# Patient Record
Sex: Male | Born: 1950 | Race: Black or African American | Hispanic: No | Marital: Married | Smoking: Never smoker
Health system: Southern US, Community
[De-identification: ages and names within clinical notes are randomized; demographics above are authoritative.]

## PROBLEM LIST (undated history)

## (undated) DIAGNOSIS — I10 Essential (primary) hypertension: Secondary | ICD-10-CM

## (undated) HISTORY — DX: Essential (primary) hypertension: I10

---

## 2001-07-23 ENCOUNTER — Emergency Department (HOSPITAL_COMMUNITY): Admission: EM | Admit: 2001-07-23 | Discharge: 2001-07-23 | Payer: Self-pay

## 2019-11-22 ENCOUNTER — Encounter: Payer: Self-pay | Admitting: Internal Medicine

## 2019-12-06 ENCOUNTER — Ambulatory Visit: Payer: Medicare Other | Admitting: Internal Medicine

## 2019-12-06 ENCOUNTER — Other Ambulatory Visit: Payer: Self-pay

## 2019-12-06 ENCOUNTER — Encounter: Payer: Self-pay | Admitting: Internal Medicine

## 2019-12-06 VITALS — BP 172/86 | HR 105 | Ht 71.0 in | Wt 233.8 lb

## 2019-12-06 DIAGNOSIS — I10 Essential (primary) hypertension: Secondary | ICD-10-CM | POA: Diagnosis not present

## 2019-12-06 DIAGNOSIS — R42 Dizziness and giddiness: Secondary | ICD-10-CM

## 2019-12-06 DIAGNOSIS — I493 Ventricular premature depolarization: Secondary | ICD-10-CM

## 2019-12-06 NOTE — Patient Instructions (Signed)
Medication Instructions:  No changes *If you need a refill on your cardiac medications before your next appointment, please call your pharmacy*  Lab Work: None ordered  Testing/Procedures: Your physician has requested that you have an echocardiogram. Echocardiography is a painless test that uses sound waves to create images of your heart. It provides your doctor with information about the size and shape of your heart and how well your heart's chambers and valves are working. This procedure takes approximately one hour. There are no restrictions for this procedure. [place] has requested that you have a lexiscan myoview. For further information please visit [email or web address]. Please follow instruction sheet, as given. [place]    Follow-Up: At [facility], you and your health needs are our priority.  As part of our continuing mission to provide you with exceptional heart care, we have created designated Provider Care Teams.  These Care Teams include your primary Cardiologist (physician) and Advanced Practice Providers (APPs -  Physician Assistants and Nurse Practitioners) who all work together to provide you with the care you need, when you need it.  Your next appointment:   1 month(s)  The format for your next appointment:   In Person  Provider:   K. Mali Hilty, MD  Other Instructions Purchase an Omron Arm Blood Pressure Cuff to monitor your BP daily

## 2019-12-07 ENCOUNTER — Encounter: Payer: Self-pay | Admitting: Internal Medicine

## 2019-12-07 NOTE — Progress Notes (Signed)
OFFICE CONSULT NOTE  Chief Complaint:  Palpitations, lightheadedness  Primary Care Physician: London Pepper, MD  HPI:  Samuel George is a 69 y.o. male who is being seen today for the evaluation of lightheadedness at the request of London Pepper, MD.  This is a pleasant 69 year old male with a longstanding history of hypertension however he said it was borderline and had not been treated until recently.  He had complained of some positional lightheadedness and mild shortness of breath with exertion however he chalked that up to not exercising much.  Recently saw his PCP and an EKG was performed which showed frequent PVCs.  Today he is noted to be in sinus rhythm with PVCs suggestive of a bigeminal rhythm.  He is not currently describing any palpitations.  Blood pressure was elevated significantly today 172/86.  He was recently started on amlodipine 5 mg daily and is on low-dose aspirin.  He reports no significant history of heart disease in the family.  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  He says he generally does not get chest pain or shortness of breath with exertion.  Recent labs were performed and appeared to be essentially normal.  Creatinine is normal.  Potassium was 4.2.  TSH was normal.  PMHx:  Past Medical History:  Diagnosis Date  . Hypertension     History reviewed. No pertinent surgical history.  FAMHx:  History reviewed. No pertinent family history.  No pertinent cardiac history.  SOCHx:   reports that he has never smoked. He has never used smokeless tobacco. No history on file for alcohol and drug.  ALLERGIES:  No Known Allergies  ROS: Pertinent items noted in HPI and remainder of comprehensive ROS otherwise negative.  HOME MEDS: Current Outpatient Medications on File Prior to Visit  Medication Sig Dispense Refill  . amLODipine (NORVASC) 5 MG tablet Take 5 mg by mouth daily.    [name] aspirin EC 81  MG tablet Take 81 mg by mouth daily.     No current facility-administered medications on file prior to visit.    LABS/IMAGING: No results found for this or any previous visit (from the past 48 hour(s)). No results found.  LIPID PANEL: No results found for: CHOL, TRIG, HDL, CHOLHDL, VLDL, LDLCALC, LDLDIRECT  WEIGHTS: Wt Readings from Last 3 Encounters:  12/06/19 233 lb 12.8 oz (106.1 kg)    VITALS: BP (!) 172/86   Pulse (!) 105   Ht 5\' 11"  (1.803 m)   Wt 233 lb 12.8 oz (106.1 kg)   SpO2 99%   BMI 32.61 kg/m   EXAM: General appearance: alert and no distress Neck: no carotid bruit, no JVD and thyroid not enlarged, symmetric, no tenderness/mass/nodules Lungs: clear to auscultation bilaterally Heart: regular rate and rhythm, S1, S2 normal, no murmur, click, rub or gallop Abdomen: soft, non-tender; bowel sounds normal; no masses,  no organomegaly Extremities: extremities normal, atraumatic, no cyanosis or edema Pulses: 2+ and symmetric Skin: Skin color, texture, turgor normal. No rashes or lesions Neurologic: Grossly normal Psych: Pleasant  EKG: Sinus rhythm with frequent PVCs and bigeminy, moderate voltage for LVH, nonspecific ST and T wave changes at 105- personally reviewed  ASSESSMENT: 1. 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 Lightheadedness/palpitations 3. Uncontrolled hypertension  PLAN: 1.   Samuel George is found to have an abnormal EKG with frequent PVCs and bigeminy.  This may be the cause of his symptoms however the etiology  of this is unclear.  He likely has had longstanding uncontrolled hypertension with evidence of LVH on his EKG.  He says it was never "high enough" to be treated until recently.  I would like to get an echo to see if there is any structural changes to his heart as well as a Lexiscan Myoview to rule out any ischemia.  Finally will place a 2-week monitor to see if we can pick up in his palpitations and determine his burden of PVCs.  He will  likely need beta-blocker therapy to see if we can suppress his PVCs.  Thanks again for the referral.  Follow-up with me afterwards.  Pixie Casino, MD, [facility], [place] Director of the Advanced Lipid Disorders &  Cardiovascular Risk Reduction Clinic Diplomate of the American Board of Clinical Lipidology Attending Cardiologist  Direct Dial: [phone]  Fax: [phone]  Website:  [email or web address] 12/07/2019, 10:41 AM

## 2019-12-11 ENCOUNTER — Telehealth: Payer: Self-pay | Admitting: *Deleted

## 2019-12-11 NOTE — Telephone Encounter (Signed)
14 day ZIO XT to be mailed to the patients home.  Patient to apply the monitor after his Echocardiogram which is scheduled on 12/20/2019.   The ZIO patch monitor cannot be removed and re-applied to accommodate other testing.  Instructions reviewed briefly with patient as they are included in the monitor kit.

## 2019-12-11 NOTE — Addendum Note (Signed)
Addended by: Fidel Levy on: 12/11/2019 09:36 AM   Modules accepted: Orders

## 2019-12-14 ENCOUNTER — Telehealth (HOSPITAL_COMMUNITY): Payer: Self-pay | Admitting: *Deleted

## 2019-12-14 NOTE — Telephone Encounter (Signed)
Patient given detailed instructions per Myocardial Perfusion Study Information Sheet for the test on 12/20/19. Patient notified to arrive 15 minutes early and that it is imperative to arrive on time for appointment to keep from having the test rescheduled.  If you need to cancel or reschedule your appointment, please call the office within 24 hours of your appointment. . Patient verbalized understanding. Kirstie Peri

## 2019-12-20 ENCOUNTER — Ambulatory Visit (HOSPITAL_BASED_OUTPATIENT_CLINIC_OR_DEPARTMENT_OTHER): Payer: Medicare Other

## 2019-12-20 ENCOUNTER — Ambulatory Visit (HOSPITAL_COMMUNITY): Payer: Medicare Other | Attending: Cardiovascular Disease

## 2019-12-20 ENCOUNTER — Other Ambulatory Visit: Payer: Self-pay

## 2019-12-20 ENCOUNTER — Other Ambulatory Visit (HOSPITAL_COMMUNITY): Payer: Medicare Other

## 2019-12-20 VITALS — Ht 71.0 in | Wt 233.0 lb

## 2019-12-20 DIAGNOSIS — I493 Ventricular premature depolarization: Secondary | ICD-10-CM | POA: Insufficient documentation

## 2019-12-20 DIAGNOSIS — I517 Cardiomegaly: Secondary | ICD-10-CM | POA: Diagnosis not present

## 2019-12-20 DIAGNOSIS — R42 Dizziness and giddiness: Secondary | ICD-10-CM | POA: Diagnosis present

## 2019-12-20 DIAGNOSIS — I10 Essential (primary) hypertension: Secondary | ICD-10-CM | POA: Insufficient documentation

## 2019-12-20 LAB — MYOCARDIAL PERFUSION IMAGING
LV dias vol: 144 mL (ref 62–150)
LV sys vol: 95 mL
Peak HR: 87 {beats}/min
Rest HR: 67 {beats}/min
SDS: 0
SRS: 0
SSS: 0
TID: 1.15

## 2019-12-20 LAB — ECHOCARDIOGRAM COMPLETE
Height: 71 in
Weight: 3728 oz

## 2019-12-20 MED ORDER — REGADENOSON 0.4 MG/5ML IV SOLN
0.4000 mg | Freq: Once | INTRAVENOUS | Status: AC
  Administered 2019-12-20: 0.4 mg via INTRAVENOUS

## 2019-12-20 MED ORDER — TECHNETIUM TC 99M TETROFOSMIN IV KIT
10.1000 | PACK | Freq: Once | INTRAVENOUS | Status: AC | PRN
  Administered 2019-12-20: 10.1 via INTRAVENOUS
  Filled 2019-12-20: qty 11

## 2019-12-20 MED ORDER — TECHNETIUM TC 99M TETROFOSMIN IV KIT
32.7000 | PACK | Freq: Once | INTRAVENOUS | Status: AC | PRN
  Administered 2019-12-20: 32.7 via INTRAVENOUS
  Filled 2019-12-20: qty 33

## 2019-12-21 ENCOUNTER — Telehealth: Payer: Self-pay | Admitting: Internal Medicine

## 2019-12-21 ENCOUNTER — Ambulatory Visit (INDEPENDENT_AMBULATORY_CARE_PROVIDER_SITE_OTHER): Payer: Medicare Other

## 2019-12-21 DIAGNOSIS — I493 Ventricular premature depolarization: Secondary | ICD-10-CM | POA: Diagnosis not present

## 2019-12-21 NOTE — Telephone Encounter (Signed)
LM for patient concerning 01/02/2020 visit with MD. He is supposed to be f/u on testing but his 14 day monitor was not applied until 12/20/2019. The results of this will not be available for his f/u visit. Asked that patient call back to r/s his f/u visit for the next week or even later, so all results will be able to be reviewed at visit with MD

## 2019-12-25 NOTE — Telephone Encounter (Signed)
Spoke with patient about r/s appt for Feb 2 since monitor results will not be back. He is OK with this. Moved appt to Feb 10 @ 2:30pm

## 2020-01-02 ENCOUNTER — Ambulatory Visit: Payer: Medicare Other | Admitting: Internal Medicine

## 2020-01-10 ENCOUNTER — Other Ambulatory Visit: Payer: Self-pay

## 2020-01-10 ENCOUNTER — Encounter: Payer: Self-pay | Admitting: Internal Medicine

## 2020-01-10 ENCOUNTER — Ambulatory Visit: Payer: Medicare Other | Admitting: Internal Medicine

## 2020-01-10 VITALS — BP 186/91 | HR 70 | Temp 97.3°F | Ht 72.0 in | Wt 227.8 lb

## 2020-01-10 DIAGNOSIS — I493 Ventricular premature depolarization: Secondary | ICD-10-CM

## 2020-01-10 DIAGNOSIS — I10 Essential (primary) hypertension: Secondary | ICD-10-CM

## 2020-01-10 MED ORDER — AMLODIPINE BESYLATE 10 MG PO TABS: 10.0000 mg | ORAL_TABLET | Freq: Every day | ORAL | 3 refills | Status: DC

## 2020-01-10 MED ORDER — CARVEDILOL 6.25 MG PO TABS: 6.2500 mg | ORAL_TABLET | Freq: Two times a day (BID) | ORAL | 3 refills | Status: DC

## 2020-01-10 NOTE — Patient Instructions (Signed)
Medication Instructions:  INCREASE amlodipine to 10mg  daily START carvedilol 6.25mg  twice daily *If you need a refill on your cardiac medications before your next appointment, please call your pharmacy*  Follow-Up: At [facility], you and your health needs are our priority.  As part of our continuing mission to provide you with exceptional heart care, we have created designated Provider Care Teams.  These Care Teams include your primary Cardiologist (physician) and Advanced Practice Providers (APPs -  Physician Assistants and Nurse Practitioners) who all work together to provide you with the care you need, when you need it.  Your next appointment:   1 month(s)  The format for your next appointment:   Virtual Visit   Provider:   K. Mali Hilty, MD  Other Instructions

## 2020-01-10 NOTE — Progress Notes (Signed)
OFFICE CONSULT NOTE  Chief Complaint:  Palpitations, lightheadedness  Primary Care Physician: London Pepper, MD  HPI:  Samuel George is a 69 y.o. male who is being seen today for the evaluation of lightheadedness at the request of London Pepper, MD.  This is a pleasant 69 year old male with a longstanding history of hypertension however he said it was borderline and had not been treated until recently.  He had complained of some positional lightheadedness and mild shortness of breath with exertion however he chalked that up to not exercising much.  Recently saw his PCP and an EKG was performed which showed frequent PVCs.  Today he is noted to be in sinus rhythm with PVCs suggestive of a bigeminal rhythm.  He is not currently describing any palpitations.  Blood pressure was elevated significantly today 172/86.  He was recently started on amlodipine 5 mg daily and is on low-dose aspirin.  He reports no significant history of heart disease in the family.  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  He says he generally does not get chest pain or shortness of breath with exertion.  Recent labs were performed and appeared to be essentially normal.  Creatinine is normal.  Potassium was 4.2.  TSH was normal.  01/10/2020  Mr. Kidney returns today for follow-up.  He reported getting a blood pressure cuff but cannot tell me what his blood pressure readings were.  He says he was started on another blood pressure medicine but was not clear what that was.  He then told me that he ran out of his medications and had no refills.  That being said he was scheduled for follow-up with Dr. Orland Mustard in May.  He did undergo an echocardiogram and nuclear stress test.  The echo showed normal systolic function, no LVH and no valvular abnormalities.  The Myoview stress test showed normal perfusion with frequent PVCs and LVEF of 34% however this was felt to be a  gating artifact.  Given that the echo was performed on the same day, I suspect the LVEF on echo of 55 to 60% to be accurate.  Mr. Kibbey his blood pressure appears to be poorly controlled.  We did find out that he was on lisinopril 10 mg in addition to his amlodipine 5 mg.  Currently he is taking no medication.  He also wore a monitor however that result has not yet resulted.  PMHx:  Past Medical History:  Diagnosis Date  . Hypertension     History reviewed. No pertinent surgical history.  FAMHx:  History reviewed. No pertinent family history.  No pertinent cardiac history.  SOCHx:   reports that he has never smoked. He has never used smokeless tobacco. No history on file for alcohol and drug.  ALLERGIES:  No Known Allergies  ROS: Pertinent items noted in HPI and remainder of comprehensive ROS otherwise negative.  HOME MEDS: Current Outpatient Medications on File Prior to Visit  Medication Sig Dispense Refill  . amLODipine (NORVASC) 5 MG tablet Take 5 mg by mouth daily.    [name] aspirin EC 81 MG tablet Take 81 mg by mouth daily.     No current facility-administered medications on file prior to visit.    LABS/IMAGING: No results found for this or any previous visit (from the past 48 hour(s)). No results found.  LIPID PANEL: No results found for: CHOL, TRIG, HDL, CHOLHDL, VLDL, LDLCALC, LDLDIRECT  WEIGHTS: Wt Readings from  Last 3 Encounters:  01/10/20 227 lb 12.8 oz (103.3 kg)  12/20/19 233 lb (105.7 kg)  12/06/19 233 lb 12.8 oz (106.1 kg)    VITALS: BP (!) 186/91   Pulse 70   Temp (!) 97.3 F (36.3 C)   Ht 6' (1.829 m)   Wt 227 lb 12.8 oz (103.3 kg)   SpO2 100%   BMI 30.90 kg/m   EXAM: Deferred  EKG: Deferred  ASSESSMENT: 1. 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 Lightheadedness/palpitations 3. Uncontrolled hypertension  PLAN: 1.   Mr. Bakalar continues to have uncontrolled hypertension.  He was on to lower potency medications and finish the  prescription but said he had no refills.  He is also not scheduled to see his primary care provider until May.  I am not sure why he was on such a low dose of 2 medications without maximizing the dose of 1 medication first.  I advised restarting amlodipine and increasing to 10 mg daily.  In addition, since he is having frequent palpitations and PVCs, I will recommend carvedilol 6.25 mg twice daily.  This should help with both blood pressure and suppress PVCs.  I will contact him with the results of his monitor to determine his burden of PVCs, however fortunately it does not look like he has developed a cardiomyopathy based on his echo and there is no reversible ischemia on his Myoview to be a source of his PVCs.  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  Plan follow-up with me in a virtual visit in 1 month at which time we will review her record of his blood pressure readings and further adjust his medication as necessary.  Pixie Casino, MD, [facility], [place] Director of the Advanced Lipid Disorders &  Cardiovascular Risk Reduction Clinic Diplomate of the American Board of Clinical Lipidology Attending Cardiologist  Direct Dial: [phone]  Fax: [phone]  Website:  [email or web address] 01/10/2020, 2:30 PM

## 2020-02-07 ENCOUNTER — Encounter: Payer: Self-pay | Admitting: Internal Medicine

## 2020-02-07 ENCOUNTER — Telehealth (INDEPENDENT_AMBULATORY_CARE_PROVIDER_SITE_OTHER): Payer: Medicare Other | Admitting: Internal Medicine

## 2020-02-07 DIAGNOSIS — I493 Ventricular premature depolarization: Secondary | ICD-10-CM | POA: Diagnosis not present

## 2020-02-07 DIAGNOSIS — I10 Essential (primary) hypertension: Secondary | ICD-10-CM | POA: Insufficient documentation

## 2020-02-07 MED ORDER — IRBESARTAN 150 MG PO TABS: 150.0000 mg | ORAL_TABLET | Freq: Every day | ORAL | 11 refills | Status: DC

## 2020-02-07 NOTE — Progress Notes (Signed)
Virtual Visit via Telephone Note   This visit type was conducted due to national recommendations for restrictions regarding the COVID-19 Pandemic (e.g. social distancing) in an effort to limit this patient's exposure and mitigate transmission in our community.  Due to his co-morbid illnesses, this patient is at least at moderate risk for complications without adequate follow up.  This format is felt to be most appropriate for this patient at this time.  The patient did not have access to video technology/had technical difficulties with video requiring transitioning to audio format only (telephone).  All issues noted in this document were discussed and addressed.  No physical exam could be performed with this format.  Please refer to the patient's chart for his  consent to telehealth for [facility].   Evaluation Performed:  Follow-up bp  Date:  02/07/2020   ID:  Samuel George, DOB 12/06/1950, MRN [id number]  Patient Location:  [place] [address]  Provider location:   [address] [place], [place] [address]  PCP:  London Pepper, MD  Cardiologist:  No primary care provider on file. Electrophysiologist:  None   Chief Complaint: Fatigue  History of Present Illness:    Samuel George is a 69 y.o. male who presents via audio/video conferencing for a telehealth visit today.  Mr. Abeyta returns today for follow-up of hypertension.  He seems to be tolerating amlodipine in addition to carvedilol.  He is not aware of his PVCs and therefore cannot comment on whether there is been some improvement in that.  He did not take his pulse today.  He did give number of blood pressure readings which were sent in.  They detail some improvement in blood pressures which average systolic blood pressure around 150, previously around 180.  He noted that in the past he had been on lisinopril as well.  I feel like he will likely need another agent for better blood pressure  control.  He does report some fatigue which could be related to beta-blocker or the fact that he has quite busy at work.  The patient does not have symptoms concerning for COVID-19 infection (fever, chills, cough, or new SHORTNESS OF BREATH).    Prior CV studies:   The following studies were reviewed today:  Chart reviewed  PMHx:  Past Medical History:  Diagnosis Date  . Hypertension     No past surgical history on file.  FAMHx:  No family history on file.  SOCHx:   reports that he has never smoked. He has never used smokeless tobacco. No history on file for alcohol and drug.  ALLERGIES:  No Known Allergies  MEDS:  Current Meds  Medication Sig  . amLODipine (NORVASC) 10 MG tablet Take 1 tablet (10 mg total) by mouth daily.  [name] aspirin EC 81 MG tablet Take 81 mg by mouth daily.  . carvedilol (COREG) 6.25 MG tablet Take 1 tablet (6.25 mg total) by mouth 2 (two) times daily.  . Multiple Vitamin (MULTIVITAMIN ADULT PO) Take by mouth daily.     ROS: Pertinent items noted in HPI and remainder of comprehensive ROS otherwise negative.  Labs/Other Tests and Data Reviewed:    Recent Labs: No results found for requested labs within last 8760 hours.   Recent Lipid Panel No results found for: CHOL, TRIG, HDL, CHOLHDL, LDLCALC, LDLDIRECT  Wt Readings from Last 3 Encounters:  01/10/20 227 lb 12.8 oz (103.3 kg)  12/20/19 233 lb (105.7 kg)  12/06/19 233 lb 12.8  oz (106.1 kg)     Exam:    Vital Signs:  BP (!) 154/88    Exam not performed due to telephone visit  ASSESSMENT & PLAN:    1. 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 Lightheadedness/palpitations 3. Uncontrolled hypertension  Mr. Cichowski has some improvement in his blood pressure although remains uncontrolled.  I think he will require another agent.  Would recommend adding irbesartan 150 mg daily.  We will continue carvedilol and amlodipine.  Plan virtual follow-up in about a month and will review his  blood pressures at that time.  COVID-19 Education: The signs and symptoms of COVID-19 were discussed with the patient and how to seek care for testing (follow up with PCP or arrange E-visit).  The importance of social distancing was discussed today.  Patient Risk:   After full review of this patients clinical status, I feel that they are at least moderate risk at this time.  Time:   Today, I have spent 25 minutes with the patient with telehealth technology discussing PVCs, fatigue, palpitations, uncontrolled hypertension.     Medication Adjustments/Labs and Tests Ordered: Current medicines are reviewed at length with the patient today.  Concerns regarding medicines are outlined above.   Tests Ordered: No orders of the defined types were placed in this encounter.   Medication Changes: No orders of the defined types were placed in this encounter.   Disposition:  in 1 month(s)  Pixie Casino, MD, [facility], [place] Director of the Advanced Lipid Disorders &  Cardiovascular Risk Reduction Clinic Diplomate of the American Board of Clinical Lipidology Attending Cardiologist  Direct Dial: [phone]  Fax: [phone]  Website:  [email or web address]  Pixie Casino, MD  02/07/2020 8:32 AM

## 2020-02-07 NOTE — Patient Instructions (Signed)
Medication Instructions:  START irbesartan 150mg  daily in addition to current medications *If you need a refill on your cardiac medications before your next appointment, please call your pharmacy*  Follow-Up: At [facility], you and your health needs are our priority.  As part of our continuing mission to provide you with exceptional heart care, we have created designated Provider Care Teams.  These Care Teams include your primary Cardiologist (physician) and Advanced Practice Providers (APPs -  Physician Assistants and Nurse Practitioners) who all work together to provide you with the care you need, when you need it.  We recommend signing up for the patient portal called "MyChart".  Sign up information is provided on this After Visit Summary.  MyChart is used to connect with patients for Virtual Visits (Telemedicine).  Patients are able to view lab/test results, encounter notes, upcoming appointments, etc.  Non-urgent messages can be sent to your provider as well.   To learn more about what you can do with MyChart, go to [email or web address].    Your next appointment:   1 month(s)  The format for your next appointment:   Virtual Visit   Provider:   K. Mali Hilty, MD   Other Instructions Please continue to check BP at home

## 2020-03-06 ENCOUNTER — Telehealth: Payer: Self-pay | Admitting: Internal Medicine

## 2020-03-06 NOTE — Telephone Encounter (Signed)
Patient states he is sending information through [place] and wanted to make sure it was being sent correctly. He did not wish to give any further information to me.

## 2020-03-08 ENCOUNTER — Encounter: Payer: Self-pay | Admitting: Internal Medicine

## 2020-03-08 ENCOUNTER — Telehealth (INDEPENDENT_AMBULATORY_CARE_PROVIDER_SITE_OTHER): Payer: Medicare Other | Admitting: Internal Medicine

## 2020-03-08 VITALS — BP 162/85 | HR 70

## 2020-03-08 DIAGNOSIS — I10 Essential (primary) hypertension: Secondary | ICD-10-CM

## 2020-03-08 DIAGNOSIS — Z79899 Other long term (current) drug therapy: Secondary | ICD-10-CM

## 2020-03-08 DIAGNOSIS — R42 Dizziness and giddiness: Secondary | ICD-10-CM | POA: Diagnosis not present

## 2020-03-08 DIAGNOSIS — I1A0 Resistant hypertension: Secondary | ICD-10-CM

## 2020-03-08 DIAGNOSIS — R002 Palpitations: Secondary | ICD-10-CM | POA: Diagnosis not present

## 2020-03-08 DIAGNOSIS — R9431 Abnormal electrocardiogram [ECG] [EKG]: Secondary | ICD-10-CM | POA: Diagnosis not present

## 2020-03-08 DIAGNOSIS — N522 Drug-induced erectile dysfunction: Secondary | ICD-10-CM

## 2020-03-08 DIAGNOSIS — I493 Ventricular premature depolarization: Secondary | ICD-10-CM

## 2020-03-08 MED ORDER — IRBESARTAN-HYDROCHLOROTHIAZIDE 300-12.5 MG PO TABS: 1.0000 | ORAL_TABLET | Freq: Every day | ORAL | 11 refills | Status: DC

## 2020-03-08 MED ORDER — CARVEDILOL 3.125 MG PO TABS: 3.1250 mg | ORAL_TABLET | Freq: Two times a day (BID) | ORAL | 11 refills | Status: DC

## 2020-03-08 NOTE — Patient Instructions (Signed)
Medication Instructions:  DECREASE carvedilol to 3.125mg  twice daily STOP irbesartan START irbesartan-hctz 300-12.5mg  daily  *If you need a refill on your cardiac medications before your next appointment, please call your pharmacy*   Lab Work: BMET (non-fasting lab) in 1 week @ Dr. Lysbeth Penner office   If you have labs (blood work) drawn today and your tests are completely normal, you will receive your results only by: [name] MyChart Message (if you have MyChart) OR . A paper copy in the mail If you have any lab test that is abnormal or we need to change your treatment, we will call you to review the results.   Testing/Procedures: NONE   Follow-Up: At [facility], you and your health needs are our priority.  As part of our continuing mission to provide you with exceptional heart care, we have created designated Provider Care Teams.  These Care Teams include your primary Cardiologist (physician) and Advanced Practice Providers (APPs -  Physician Assistants and Nurse Practitioners) who all work together to provide you with the care you need, when you need it.  We recommend signing up for the patient portal called "MyChart".  Sign up information is provided on this After Visit Summary.  MyChart is used to connect with patients for Virtual Visits (Telemedicine).  Patients are able to view lab/test results, encounter notes, upcoming appointments, etc.  Non-urgent messages can be sent to your provider as well.   To learn more about what you can do with MyChart, go to [email or web address].    Your next appointment:   3 month(s)  The format for your next appointment:   In Person  Provider:   You may see Dr. Lyman Bishop or one of the following Advanced Practice Providers on your designated Care Team:    Almyra Deforest, PA-C  Fabian Sharp, [place] or   Roby Lofts, [place]    Other Instructions  You have been referred to Dr. Skeet Latch with [facility] @ [address]

## 2020-03-08 NOTE — Progress Notes (Signed)
Virtual Visit via Telephone Note   This visit type was conducted due to national recommendations for restrictions regarding the COVID-19 Pandemic (e.g. social distancing) in an effort to limit this patient's exposure and mitigate transmission in our community.  Due to his co-morbid illnesses, this patient is at least at moderate risk for complications without adequate follow up.  This format is felt to be most appropriate for this patient at this time.  The patient did not have access to video technology/had technical difficulties with video requiring transitioning to audio format only (telephone).  All issues noted in this document were discussed and addressed.  No physical exam could be performed with this format.  Please refer to the patient's chart for his  consent to telehealth for [facility].   Evaluation Performed:  Caregility video visit  Date:  03/08/2020   ID:  Samuel George, DOB 1951/09/17, MRN [id number]  Patient Location:  [place] [address]  Provider location:   [address], [place], [place] [address]  PCP:  London Pepper, MD  Cardiologist:  No primary care provider on file. Electrophysiologist:  None   Chief Complaint: Fatigue  History of Present Illness:    Samuel George is a 69 y.o. male who presents via audio/video conferencing for a telehealth visit today.  Samuel George returns today for follow-up of hypertension.  He seems to be tolerating amlodipine in addition to carvedilol.  He is not aware of his PVCs and therefore cannot comment on whether there is been some improvement in that.  He did not take his pulse today.  He did give number of blood pressure readings which were sent in.  They detail some improvement in blood pressures which average systolic blood pressure around 150, previously around 180.  He noted that in the past he had been on lisinopril as well.  I feel like he will likely need another agent for better blood  pressure control.  He does report some fatigue which could be related to beta-blocker or the fact that he has quite busy at work.  03/08/2020  Samuel George is seen today for video follow-up via care agility.  He reports some downward trend in his blood pressures however still does not appear to be well controlled.  His diastolic blood pressures are now improved however systolic still remain between 150-170.  This is despite 3 different medications.  He is now started to have some side effects including some voice changes, hoarseness as well as erectile dysfunction which was not previously an issue.  The patient does not have symptoms concerning for COVID-19 infection (fever, chills, cough, or new SHORTNESS OF BREATH).    Prior CV studies:   The following studies were reviewed today:  Chart reviewed  PMHx:  Past Medical History:  Diagnosis Date  . Hypertension     No past surgical history on file.  FAMHx:  No family history on file.  SOCHx:   reports that he has never smoked. He has never used smokeless tobacco. No history on file for alcohol and drug.  ALLERGIES:  No Known Allergies  MEDS:  Current Meds  Medication Sig  . amLODipine (NORVASC) 10 MG tablet Take 1 tablet (10 mg total) by mouth daily.  [name] aspirin EC 81 MG tablet Take 81 mg by mouth daily.  . carvedilol (COREG) 6.25 MG tablet Take 1 tablet (6.25 mg total) by mouth 2 (two) times daily.  . irbesartan (AVAPRO) 150 MG tablet Take 1  tablet (150 mg total) by mouth daily.  . Multiple Vitamin (MULTIVITAMIN ADULT PO) Take by mouth daily.     ROS: Pertinent items noted in HPI and remainder of comprehensive ROS otherwise negative.  Labs/Other Tests and Data Reviewed:    Recent Labs: No results found for requested labs within last 8760 hours.   Recent Lipid Panel No results found for: CHOL, TRIG, HDL, CHOLHDL, LDLCALC, LDLDIRECT  Wt Readings from Last 3 Encounters:  01/10/20 227 lb 12.8 oz (103.3 kg)  12/20/19 233  lb (105.7 kg)  12/06/19 233 lb 12.8 oz (106.1 kg)     Exam:    Vital Signs:  BP (!) 162/85   Pulse 70    General appearance: alert and no distress Lungs: No visual breathing difficulty Extremities: extremities normal, atraumatic, no cyanosis or edema Neurologic: Grossly normal  ASSESSMENT & PLAN:    1. 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 Lightheadedness/palpitations 3. Uncontrolled hypertension  Samuel George has had some improvement in blood pressure however suboptimal.  He is compliant with medications.  He is now complaining of some erectile dysfunction which may be related to the beta-blocker.  This is necessary for his PVCs however his side effects are not tolerable.  I recommend decreasing that to 3.125 mg twice daily.  Will therefore increase his irbesartan from 150 to 300 mg daily and add the combination HCTZ 12.5 mg.  Check be met in a week.  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  I wonder if he might also have hyperaldosteronism or other reasons for less than optimal blood pressure response.  I would like to refer him to Dr. Oval Linsey in our hypertension clinic for more specialty evaluation.  COVID-19 Education: The signs and symptoms of COVID-19 were discussed with the patient and how to seek care for testing (follow up with PCP or arrange E-visit).  The importance of social distancing was discussed today.  Patient Risk:   After full review of this patients clinical status, I feel that they are at least moderate risk at this time.  Time:   Today, I have spent 25 minutes with the patient with telehealth technology discussing PVCs, fatigue, palpitations, uncontrolled hypertension.     Medication Adjustments/Labs and Tests Ordered: Current medicines are reviewed at length with the patient today.  Concerns regarding medicines are outlined above.   Tests Ordered: No orders of the defined types were placed in this encounter.   Medication Changes: No  orders of the defined types were placed in this encounter.   Disposition:  in 3 month(s)  Pixie Casino, MD, [facility], [place] Director of the Advanced Lipid Disorders &  Cardiovascular Risk Reduction Clinic Diplomate of the American Board of Clinical Lipidology Attending Cardiologist  Direct Dial: [phone]  Fax: [phone]  Website:  [email or web address]  Pixie Casino, MD  03/08/2020 8:35 AM

## 2020-03-15 ENCOUNTER — Other Ambulatory Visit: Payer: Self-pay | Admitting: Internal Medicine

## 2020-03-15 LAB — BASIC METABOLIC PANEL
BUN/Creatinine Ratio: 17 (ref 10–24)
BUN: 21 mg/dL (ref 8–27)
CO2: 23 mmol/L (ref 20–29)
Calcium: 9.1 mg/dL (ref 8.6–10.2)
Chloride: 99 mmol/L (ref 96–106)
Creatinine, Ser: 1.25 mg/dL (ref 0.76–1.27)
GFR calc Af Amer: 67 mL/min/{1.73_m2} (ref >59)
GFR calc non Af Amer: 58 mL/min/{1.73_m2} — ABNORMAL LOW (ref >59)
Glucose: 110 mg/dL — ABNORMAL HIGH (ref 65–99)
Potassium: 4.9 mmol/L (ref 3.5–5.2)
Sodium: 137 mmol/L (ref 134–144)

## 2020-03-19 ENCOUNTER — Other Ambulatory Visit: Payer: Self-pay

## 2020-03-19 ENCOUNTER — Ambulatory Visit: Payer: Medicare Other | Admitting: Cardiovascular Disease

## 2020-03-19 ENCOUNTER — Encounter: Payer: Self-pay | Admitting: Cardiovascular Disease

## 2020-03-19 VITALS — BP 200/80 | Ht 71.0 in | Wt 227.0 lb

## 2020-03-19 DIAGNOSIS — I10 Essential (primary) hypertension: Secondary | ICD-10-CM

## 2020-03-19 DIAGNOSIS — Z5181 Encounter for therapeutic drug level monitoring: Secondary | ICD-10-CM

## 2020-03-19 DIAGNOSIS — I493 Ventricular premature depolarization: Secondary | ICD-10-CM | POA: Diagnosis not present

## 2020-03-19 MED ORDER — SPIRONOLACTONE 25 MG PO TABS: 25.0000 mg | ORAL_TABLET | Freq: Every day | ORAL | 3 refills | Status: DC

## 2020-03-19 MED ORDER — BYSTOLIC 20 MG PO TABS: 20.0000 mg | ORAL_TABLET | Freq: Every day | ORAL | 5 refills | Status: DC

## 2020-03-19 NOTE — Progress Notes (Signed)
Hypertension Clinic Initial Assessment:    Date:  04/07/2020   ID:  Samuel George, DOB Aug 31, 1951, MRN [id number]  PCP:  London Pepper, MD  Cardiologist:  No primary care provider on file.  Nephrologist:  Referring MD: Pixie Casino, MD   CC: Hypertension  History of Present Illness:    Samuel George is a 69 y.o. male with a hx of hypertension and PVCs here to establish care in the hypertension clinic.  He was first diagnosed with hypertension December 2020.  Prior to that he was never on medications.  He has struggled to get his blood pressure under control.  He last saw Dr. Debara George on 03/08/20 and his blood pressure remained uncontrolled on amlodipine, irbesartan, and carvedilol.  He developed some erectile dysfunction so carvedilol was reduced and irbesartan was increased.  Hydrochlorothiazide was also added to his regimen.  There was some concern for an underlying diagnosis of hyperaldosteronism.  He complains that the medications make him dizzy, listless and hard for him to focus.  He is unsure if reducing carvedilol helped his symptoms he mostly cooks at home and does not add salt to his food.  He has not been exercising much lately due to the pandemic.  Previously he exercised for about an hour daily and had no exertional chest pain or shortness of breath.  Lately it has been about 15 minutes when he does exercise.  He has not had any caffeine since 1977.  He takes a multivitamin but no other over-the-counter medications or supplements.  He denies any chest pain, shortness of breath, lower extreme edema, orthopnea, or PND.   Previous antihypertensives: Lisinopril    Past Medical History:  Diagnosis Date  . Hypertension     No past surgical history on file.  Current Medications: Current Meds  Medication Sig  . amLODipine (NORVASC) 10 MG tablet Take 1 tablet (10 mg total) by mouth daily.  [name] aspirin EC 81 MG tablet Take 81 mg by mouth daily.  . irbesartan-hydrochlorothiazide  (AVALIDE) 300-12.5 MG tablet Take 1 tablet by mouth daily.  . Multiple Vitamin (MULTIVITAMIN ADULT PO) Take by mouth daily.  . [DISCONTINUED] carvedilol (COREG) 3.125 MG tablet Take 1 tablet (3.125 mg total) by mouth 2 (two) times daily.     Allergies:   Patient has no known allergies.   Social History   Socioeconomic History  . Marital status: Married    Spouse name: Not on file  . Number of children: Not on file  . Years of education: Not on file  . Highest education level: Not on file  Occupational History  . Not on file  Tobacco Use  . Smoking status: Never Smoker  . Smokeless tobacco: Never Used  Substance and Sexual Activity  . Alcohol use: Not on file  . Drug use: Not on file  . Sexual activity: Not on file  Other Topics Concern  . Not on file  Social History Narrative  . Not on file   Social Determinants of Health   Financial Resource Strain:   . Difficulty of Paying Living Expenses:   Food Insecurity: No Food Insecurity  . Worried About Charity fundraiser in the Last Year: Never true  . Ran Out of Food in the Last Year: Never true  Transportation Needs: No Transportation Needs  . Lack of Transportation (Medical): No  . Lack of Transportation (Non-Medical): No  Physical Activity: Insufficiently Active  . Days of Exercise per Week: 3 days  . Minutes  of Exercise per Session: 30 min  Stress: No Stress Concern Present  . Feeling of Stress : Not at all  Social Connections:   . Frequency of Communication with Friends and Family:   . Frequency of Social Gatherings with Friends and Family:   . Attends Religious Services:   . Active Member of Clubs or Organizations:   . Attends Archivist Meetings:   [name] Marital Status:      Family History: The patient's family history is not on file.  ROS:   Please see the history of present illness.    All other systems reviewed and are negative.  EKGs/Labs/Other Studies Reviewed:    EKG:  EKG is not ordered  today.  The ekg ordered  demonstrates sinus rhythm.  Frequent PVCs.  Recent Labs: 03/28/2020: BUN 24; Creatinine, Ser 1.47; Potassium 4.8; Sodium 137   Recent Lipid Panel No results found for: CHOL, TRIG, HDL, CHOLHDL, VLDL, LDLCALC, LDLDIRECT  Physical Exam:    VS:  BP (!) 200/80   Ht 5\' 11"  (1.803 m)   Wt 227 lb (103 kg)   SpO2 94%   BMI 31.66 kg/m     Wt Readings from Last 3 Encounters:  03/19/20 227 lb (103 kg)  01/10/20 227 lb 12.8 oz (103.3 kg)  12/20/19 233 lb (105.7 kg)     VS:  BP (!) 200/80   Ht 5\' 11"  (1.803 m)   Wt 227 lb (103 kg)   SpO2 94%   BMI 31.66 kg/m  , BMI Body mass index is 31.66 kg/m. GENERAL:  Well appearing HEENT: Pupils equal round and reactive, fundi not visualized, oral mucosa unremarkable NECK:  No jugular venous distention, waveform within normal limits, carotid upstroke brisk and symmetric, no bruits LUNGS:  Clear to auscultation bilaterally HEART:  RRR with occasional ectopy.  PMI not displaced or sustained,S1 and S2 within normal limits, no S3, no S4, no clicks, no rubs, no murmurs ABD:  Flat, positive bowel sounds normal in frequency in pitch, no bruits, no rebound, no guarding, no midline pulsatile mass, no hepatomegaly, no splenomegaly EXT:  2 plus pulses throughout, no edema, no cyanosis no clubbing SKIN:  No rashes no nodules NEURO:  Cranial nerves II through XII grossly intact, motor grossly intact throughout PSYCH:  Cognitively intact, oriented to person place and time    ASSESSMENT:    1. Essential hypertension   2. Therapeutic drug monitoring     PLAN:    # Essential hypertension:  Mr. Heap has poorly controlled blood pressure on multiple agents.  He did not tolerate carvedilol due to fatigue and erectile dysfunction.  We will switch this to nebivolol 20 mg daily.  We will also add spironolactone 25 mg a day.  Check a BMP in a week.  He has no history of hypokalemia and again hyperaldosteronism less likely.  He does not  drink caffeine or alcohol heavily.  We will check renal artery Dopplers.  he consents to be monitored in our remote patient monitoring program through [place].  he will track his blood pressure twice daily and understands that these trends will help [place] to adjust his medications as needed prior to his next appointment.  he is interested in enrolling in the PREP exercise and nutrition program through the [facility].    # PVCs: Switching carvedilol to nebivolol as above.   Disposition:    FU with MD/PharmD in 1 month    Medication Adjustments/Labs and Tests Ordered: Current medicines are reviewed  at length with the patient today.  Concerns regarding medicines are outlined above.  Orders Placed This Encounter  Procedures  . Basic metabolic panel  . VAS US RENAL ARTERY DUPLEX   Meds ordered this encounter  Medications  . DISCONTD: spironolactone (ALDACTONE) 25 MG tablet    Sig: Take 1 tablet (25 mg total) by mouth daily.    Dispense:  90 tablet    Refill:  3  . Nebivolol HCl (BYSTOLIC) 20 MG TABS    Sig: Take 1 tablet (20 mg total) by mouth daily.    Dispense:  30 tablet    Refill:  5    D/C CARVEDILOL     Signed, Skeet Latch, MD  04/07/2020 9:48 AM    Jessie

## 2020-03-19 NOTE — Patient Instructions (Addendum)
Medication Instructions:  START SPIRONOLACTONE 25 MG DAILY   STOP CARVEDILOL   START BYSTOLIC 20 MG DAILY    Labwork: BMET IN 1 WEEK    Testing/Procedures: Your physician has requested that you have a renal artery duplex. During this test, an ultrasound is used to evaluate blood flow to the kidneys. Allow one hour for this exam. Do not eat after midnight the day before and avoid carbonated beverages. Take your medications as you usually do.   Follow-Up: Your physician recommends that you schedule a follow-up appointment in: [place], WILL CALL YOU WITH AN APPOINTMENT   PHARM D ON Apr 18, 2020 AT 8:30 AM   Special Instructions:   MONITOR YOUR BLOOD PRESSURE TWICE A DAY, LOG IN THE VIVIFY APP YOU HAVE DOWNLOADED   DASH Eating Plan DASH stands for "Dietary Approaches to Stop Hypertension." The DASH eating plan is a healthy eating plan that has been shown to reduce high blood pressure (hypertension). It may also reduce your risk for type 2 diabetes, heart disease, and stroke. The DASH eating plan may also help with weight loss. What are tips for following this plan?  General guidelines  Avoid eating more than 2,300 mg (milligrams) of salt (sodium) a day. If you have hypertension, you may need to reduce your sodium intake to 1,500 mg a day.  Limit alcohol intake to no more than 1 drink a day for nonpregnant women and 2 drinks a day for men. One drink equals 12 oz of beer, 5 oz of wine, or 1 oz of hard liquor.  Work with your health care provider to maintain a healthy body weight or to lose weight. Ask what an ideal weight is for you.  Get at least 30 minutes of exercise that causes your heart to beat faster (aerobic exercise) most days of the week. Activities may include walking, swimming, or biking.  Work with your health care provider or diet and nutrition specialist (dietitian) to adjust your eating plan to your individual calorie needs. Reading food  labels   Check food labels for the amount of sodium per serving. Choose foods with less than 5 percent of the Daily Value of sodium. Generally, foods with less than 300 mg of sodium per serving fit into this eating plan.  To find whole grains, look for the word "whole" as the first word in the ingredient list. Shopping  Buy products labeled as "low-sodium" or "no salt added."  Buy fresh foods. Avoid canned foods and premade or frozen meals. Cooking  Avoid adding salt when cooking. Use salt-free seasonings or herbs instead of table salt or sea salt. Check with your health care provider or pharmacist before using salt substitutes.  Do not fry foods. Cook foods using healthy methods such as baking, boiling, grilling, and broiling instead.  Cook with heart-healthy oils, such as olive, canola, soybean, or sunflower oil. Meal planning  Eat a balanced diet that includes: ? 5 or more servings of fruits and vegetables each day. At each meal, try to fill half of your plate with fruits and vegetables. ? Up to 6-8 servings of whole grains each day. ? Less than 6 oz of lean meat, poultry, or fish each day. A 3-oz serving of meat is about the same size as a deck of cards. One egg equals 1 oz. ? 2 servings of low-fat dairy each day. ? A serving of nuts, seeds, or beans 5 times each week. ? Heart-healthy fats. Healthy fats called  Omega-3 fatty acids are found in foods such as flaxseeds and coldwater fish, like sardines, salmon, and mackerel.  Limit how much you eat of the following: ? Canned or prepackaged foods. ? Food that is high in trans fat, such as fried foods. ? Food that is high in saturated fat, such as fatty meat. ? Sweets, desserts, sugary drinks, and other foods with added sugar. ? Full-fat dairy products.  Do not salt foods before eating.  Try to eat at least 2 vegetarian meals each week.  Eat more home-cooked food and less restaurant, buffet, and fast food.  When eating at a  restaurant, ask that your food be prepared with less salt or no salt, if possible. What foods are recommended? The items listed may not be a complete list. Talk with your dietitian about what dietary choices are best for you. Grains Whole-grain or whole-wheat bread. Whole-grain or whole-wheat pasta. Brown rice. [place]. Bulgur. Whole-grain and low-sodium cereals. Pita bread. Low-fat, low-sodium crackers. Whole-wheat flour tortillas. Vegetables Fresh or frozen vegetables (raw, steamed, roasted, or grilled). Low-sodium or reduced-sodium tomato and vegetable juice. Low-sodium or reduced-sodium tomato sauce and tomato paste. Low-sodium or reduced-sodium canned vegetables. Fruits All fresh, dried, or frozen fruit. Canned fruit in natural juice (without added sugar). Meat and other protein foods Skinless chicken or [place]. Ground chicken or [place]. Pork with fat trimmed off. Fish and seafood. Egg whites. Dried beans, peas, or lentils. Unsalted nuts, nut butters, and seeds. Unsalted canned beans. Lean cuts of beef with fat trimmed off. Low-sodium, lean deli meat. Dairy Low-fat (1%) or fat-free (skim) milk. Fat-free, low-fat, or reduced-fat cheeses. Nonfat, low-sodium ricotta or cottage cheese. Low-fat or nonfat yogurt. Low-fat, low-sodium cheese. Fats and oils Soft margarine without trans fats. Vegetable oil. Low-fat, reduced-fat, or light mayonnaise and salad dressings (reduced-sodium). Canola, safflower, olive, soybean, and sunflower oils. Avocado. Seasoning and other foods Herbs. Spices. Seasoning mixes without salt. Unsalted popcorn and pretzels. Fat-free sweets. What foods are not recommended? The items listed may not be a complete list. Talk with your dietitian about what dietary choices are best for you. Grains Baked goods made with fat, such as croissants, muffins, or some breads. Dry pasta or rice meal packs. Vegetables Creamed or fried vegetables. Vegetables in a cheese sauce.  Regular canned vegetables (not low-sodium or reduced-sodium). Regular canned tomato sauce and paste (not low-sodium or reduced-sodium). Regular tomato and vegetable juice (not low-sodium or reduced-sodium). Angie Fava. Olives. Fruits Canned fruit in a light or heavy syrup. Fried fruit. Fruit in cream or butter sauce. Meat and other protein foods Fatty cuts of meat. Ribs. Fried meat. Berniece Salines. Sausage. Bologna and other processed lunch meats. Salami. Fatback. Hotdogs. Bratwurst. Salted nuts and seeds. Canned beans with added salt. Canned or smoked fish. Whole eggs or egg yolks. Chicken or [place] with skin. Dairy Whole or 2% milk, cream, and half-and-half. Whole or full-fat cream cheese. Whole-fat or sweetened yogurt. Full-fat cheese. Nondairy creamers. Whipped toppings. Processed cheese and cheese spreads. Fats and oils Butter. Stick margarine. Lard. Shortening. Ghee. Bacon fat. Tropical oils, such as coconut, palm kernel, or palm oil. Seasoning and other foods Salted popcorn and pretzels. Onion salt, garlic salt, seasoned salt, table salt, and sea salt. Worcestershire sauce. Tartar sauce. Barbecue sauce. Teriyaki sauce. Soy sauce, including reduced-sodium. Steak sauce. Canned and packaged gravies. Fish sauce. Oyster sauce. Cocktail sauce. Horseradish that you find on the shelf. Ketchup. Mustard. Meat flavorings and tenderizers. Bouillon cubes. Hot sauce and Tabasco sauce. Premade or packaged marinades. Premade  or packaged taco seasonings. Relishes. Regular salad dressings. Where to find more information:  National Heart, Lung, and [place]: https://wilson-eaton.com/  American Heart Association: www.heart.org Summary  The DASH eating plan is a healthy eating plan that has been shown to reduce high blood pressure (hypertension). It may also reduce your risk for type 2 diabetes, heart disease, and stroke.  With the DASH eating plan, you should limit salt (sodium) intake to 2,300 mg a day. If you have  hypertension, you may need to reduce your sodium intake to 1,500 mg a day.  When on the DASH eating plan, aim to eat more fresh fruits and vegetables, whole grains, lean proteins, low-fat dairy, and heart-healthy fats.  Work with your health care provider or diet and nutrition specialist (dietitian) to adjust your eating plan to your individual calorie needs. This information is not intended to replace advice given to you by your health care provider. Make sure you discuss any questions you have with your health care provider. Document Released: 11/05/2011 Document Revised: 10/29/2017 Document Reviewed: 11/09/2016 Elsevier Patient Education  2020 [employer].

## 2020-03-28 ENCOUNTER — Ambulatory Visit (HOSPITAL_COMMUNITY)
Admission: RE | Admit: 2020-03-28 | Payer: Medicare Other | Source: Ambulatory Visit | Attending: Cardiovascular Disease | Admitting: Cardiovascular Disease

## 2020-03-28 ENCOUNTER — Encounter (HOSPITAL_COMMUNITY): Payer: Self-pay

## 2020-03-28 LAB — BASIC METABOLIC PANEL
BUN/Creatinine Ratio: 16 (ref 10–24)
BUN: 24 mg/dL (ref 8–27)
CO2: 21 mmol/L (ref 20–29)
Calcium: 9.6 mg/dL (ref 8.6–10.2)
Chloride: 101 mmol/L (ref 96–106)
Creatinine, Ser: 1.47 mg/dL — ABNORMAL HIGH (ref 0.76–1.27)
GFR calc Af Amer: 55 mL/min/{1.73_m2} — ABNORMAL LOW (ref >59)
GFR calc non Af Amer: 48 mL/min/{1.73_m2} — ABNORMAL LOW (ref >59)
Glucose: 128 mg/dL — ABNORMAL HIGH (ref 65–99)
Potassium: 4.8 mmol/L (ref 3.5–5.2)
Sodium: 137 mmol/L (ref 134–144)

## 2020-03-29 ENCOUNTER — Other Ambulatory Visit: Payer: Self-pay

## 2020-03-29 ENCOUNTER — Ambulatory Visit (HOSPITAL_COMMUNITY)
Admission: RE | Admit: 2020-03-29 | Discharge: 2020-03-29 | Disposition: A | Discharge status: Home / Self Care | Payer: Medicare Other | Source: Ambulatory Visit | Attending: Cardiovascular Disease | Admitting: Cardiovascular Disease

## 2020-03-29 DIAGNOSIS — I10 Essential (primary) hypertension: Secondary | ICD-10-CM | POA: Diagnosis present

## 2020-04-03 ENCOUNTER — Telehealth: Payer: Self-pay | Admitting: *Deleted

## 2020-04-03 NOTE — Telephone Encounter (Signed)
-----   Message from Skeet Latch, MD sent at 04/03/2020  2:15 PM EDT ----- Kidney function is slightly worse.  Reduce spironolactone to 12.5 mg.  Repeat BMP at his follow-up appointment.

## 2020-04-03 NOTE — Telephone Encounter (Signed)
Left message to call back  

## 2020-04-03 NOTE — Telephone Encounter (Signed)
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  Recommend that he see Dr. Fletcher Anon or Gwenlyn Found.

## 2020-04-04 DIAGNOSIS — Z79899 Other long term (current) drug therapy: Secondary | ICD-10-CM

## 2020-04-04 DIAGNOSIS — I10 Essential (primary) hypertension: Secondary | ICD-10-CM

## 2020-04-04 MED ORDER — SPIRONOLACTONE 25 MG PO TABS: 12.5000 mg | ORAL_TABLET | Freq: Every day | ORAL | 3 refills | Status: DC

## 2020-04-05 NOTE — Telephone Encounter (Signed)
Per mychart message patient is aware of medication changes and has appointment with Dr Fletcher Anon

## 2020-04-07 ENCOUNTER — Encounter: Payer: Self-pay | Admitting: Cardiovascular Disease

## 2020-04-14 ENCOUNTER — Encounter (HOSPITAL_COMMUNITY): Payer: Self-pay | Admitting: Radiology

## 2020-04-14 ENCOUNTER — Inpatient Hospital Stay (HOSPITAL_COMMUNITY)
Admission: EM | Admit: 2020-04-14 | Discharge: 2020-04-19 | DRG: 035 | Disposition: A | Discharge status: Home / Self Care | Payer: Medicare Other | Attending: Internal Medicine | Admitting: Internal Medicine

## 2020-04-14 ENCOUNTER — Inpatient Hospital Stay (HOSPITAL_COMMUNITY): Payer: Medicare Other

## 2020-04-14 ENCOUNTER — Other Ambulatory Visit: Payer: Self-pay

## 2020-04-14 ENCOUNTER — Emergency Department (HOSPITAL_COMMUNITY): Payer: Medicare Other

## 2020-04-14 DIAGNOSIS — R7303 Prediabetes: Secondary | ICD-10-CM | POA: Diagnosis present

## 2020-04-14 DIAGNOSIS — Z7982 Long term (current) use of aspirin: Secondary | ICD-10-CM | POA: Diagnosis not present

## 2020-04-14 DIAGNOSIS — N1831 Chronic kidney disease, stage 3a: Secondary | ICD-10-CM

## 2020-04-14 DIAGNOSIS — N390 Urinary tract infection, site not specified: Secondary | ICD-10-CM | POA: Diagnosis present

## 2020-04-14 DIAGNOSIS — Z79899 Other long term (current) drug therapy: Secondary | ICD-10-CM | POA: Diagnosis not present

## 2020-04-14 DIAGNOSIS — I63312 Cerebral infarction due to thrombosis of left middle cerebral artery: Secondary | ICD-10-CM | POA: Diagnosis present

## 2020-04-14 DIAGNOSIS — R29810 Facial weakness: Secondary | ICD-10-CM | POA: Diagnosis present

## 2020-04-14 DIAGNOSIS — E669 Obesity, unspecified: Secondary | ICD-10-CM | POA: Diagnosis present

## 2020-04-14 DIAGNOSIS — I130 Hypertensive heart and chronic kidney disease with heart failure and stage 1 through stage 4 chronic kidney disease, or unspecified chronic kidney disease: Secondary | ICD-10-CM | POA: Diagnosis present

## 2020-04-14 DIAGNOSIS — R4701 Aphasia: Secondary | ICD-10-CM | POA: Diagnosis present

## 2020-04-14 DIAGNOSIS — R279 Unspecified lack of coordination: Secondary | ICD-10-CM | POA: Diagnosis present

## 2020-04-14 DIAGNOSIS — I5032 Chronic diastolic (congestive) heart failure: Secondary | ICD-10-CM | POA: Diagnosis present

## 2020-04-14 DIAGNOSIS — I6389 Other cerebral infarction: Secondary | ICD-10-CM

## 2020-04-14 DIAGNOSIS — N183 Chronic kidney disease, stage 3 unspecified: Secondary | ICD-10-CM | POA: Diagnosis present

## 2020-04-14 DIAGNOSIS — Z6830 Body mass index (BMI) 30.0-30.9, adult: Secondary | ICD-10-CM

## 2020-04-14 DIAGNOSIS — Z8249 Family history of ischemic heart disease and other diseases of the circulatory system: Secondary | ICD-10-CM

## 2020-04-14 DIAGNOSIS — B9620 Unspecified Escherichia coli [E. coli] as the cause of diseases classified elsewhere: Secondary | ICD-10-CM | POA: Diagnosis present

## 2020-04-14 DIAGNOSIS — I973 Postprocedural hypertension: Secondary | ICD-10-CM | POA: Diagnosis not present

## 2020-04-14 DIAGNOSIS — I700 Atherosclerosis of aorta: Secondary | ICD-10-CM | POA: Diagnosis present

## 2020-04-14 DIAGNOSIS — I639 Cerebral infarction, unspecified: Secondary | ICD-10-CM

## 2020-04-14 DIAGNOSIS — I6522 Occlusion and stenosis of left carotid artery: Secondary | ICD-10-CM | POA: Diagnosis present

## 2020-04-14 DIAGNOSIS — I10 Essential (primary) hypertension: Secondary | ICD-10-CM | POA: Diagnosis not present

## 2020-04-14 DIAGNOSIS — R29700 NIHSS score 0: Secondary | ICD-10-CM | POA: Diagnosis present

## 2020-04-14 DIAGNOSIS — Z20822 Contact with and (suspected) exposure to covid-19: Secondary | ICD-10-CM | POA: Diagnosis present

## 2020-04-14 LAB — COMPREHENSIVE METABOLIC PANEL
ALT: 25 U/L (ref 0–44)
AST: 20 U/L (ref 15–41)
Albumin: 4 g/dL (ref 3.5–5.0)
Alkaline Phosphatase: 50 U/L (ref 38–126)
Anion gap: 11 (ref 5–15)
BUN: 30 mg/dL — ABNORMAL HIGH (ref 8–23)
CO2: 20 mmol/L — ABNORMAL LOW (ref 22–32)
Calcium: 9 mg/dL (ref 8.9–10.3)
Chloride: 105 mmol/L (ref 98–111)
Creatinine, Ser: 1.51 mg/dL — ABNORMAL HIGH (ref 0.61–1.24)
GFR calc Af Amer: 54 mL/min — ABNORMAL LOW (ref >60)
GFR calc non Af Amer: 46 mL/min — ABNORMAL LOW (ref >60)
Glucose, Bld: 93 mg/dL (ref 70–99)
Potassium: 4.4 mmol/L (ref 3.5–5.1)
Sodium: 136 mmol/L (ref 135–145)
Total Bilirubin: 0.4 mg/dL (ref 0.3–1.2)
Total Protein: 7.7 g/dL (ref 6.5–8.1)

## 2020-04-14 LAB — CBC
HCT: 43.5 % (ref 39.0–52.0)
Hemoglobin: 14.2 g/dL (ref 13.0–17.0)
MCH: 31.5 pg (ref 26.0–34.0)
MCHC: 32.6 g/dL (ref 30.0–36.0)
MCV: 96.5 fL (ref 80.0–100.0)
Platelets: 209 10*3/uL (ref 150–400)
RBC: 4.51 MIL/uL (ref 4.22–5.81)
RDW: 12.2 % (ref 11.5–15.5)
WBC: 4.5 10*3/uL (ref 4.0–10.5)
nRBC: 0 % (ref 0.0–0.2)

## 2020-04-14 LAB — URINALYSIS, ROUTINE W REFLEX MICROSCOPIC
Bilirubin Urine: NEGATIVE
Glucose, UA: NEGATIVE mg/dL
Hgb urine dipstick: NEGATIVE
Ketones, ur: NEGATIVE mg/dL
Nitrite: NEGATIVE
Protein, ur: NEGATIVE mg/dL
Specific Gravity, Urine: 1.008 (ref 1.005–1.030)
WBC, UA: >50 WBC/hpf — ABNORMAL HIGH (ref 0–5)
pH: 5 (ref 5.0–8.0)

## 2020-04-14 LAB — DIFFERENTIAL
Abs Immature Granulocytes: 0.04 10*3/uL (ref 0.00–0.07)
Basophils Absolute: 0 10*3/uL (ref 0.0–0.1)
Basophils Relative: 1 %
Eosinophils Absolute: 0.1 10*3/uL (ref 0.0–0.5)
Eosinophils Relative: 3 %
Immature Granulocytes: 1 %
Lymphocytes Relative: 26 %
Lymphs Abs: 1.2 10*3/uL (ref 0.7–4.0)
Monocytes Absolute: 0.4 10*3/uL (ref 0.1–1.0)
Monocytes Relative: 8 %
Neutro Abs: 2.8 10*3/uL (ref 1.7–7.7)
Neutrophils Relative %: 61 %

## 2020-04-14 LAB — I-STAT CHEM 8, ED
BUN: 31 mg/dL — ABNORMAL HIGH (ref 8–23)
Calcium, Ion: 1.19 mmol/L (ref 1.15–1.40)
Chloride: 107 mmol/L (ref 98–111)
Creatinine, Ser: 1.5 mg/dL — ABNORMAL HIGH (ref 0.61–1.24)
Glucose, Bld: 90 mg/dL (ref 70–99)
HCT: 42 % (ref 39.0–52.0)
Hemoglobin: 14.3 g/dL (ref 13.0–17.0)
Potassium: 4 mmol/L (ref 3.5–5.1)
Sodium: 137 mmol/L (ref 135–145)
TCO2: 24 mmol/L (ref 22–32)

## 2020-04-14 LAB — RAPID URINE DRUG SCREEN, HOSP PERFORMED
Amphetamines: NOT DETECTED
Barbiturates: NOT DETECTED
Benzodiazepines: NOT DETECTED
Cocaine: NOT DETECTED
Opiates: NOT DETECTED
Tetrahydrocannabinol: NOT DETECTED

## 2020-04-14 LAB — PROTIME-INR
INR: 1 (ref 0.8–1.2)
Prothrombin Time: 12.8 seconds (ref 11.4–15.2)

## 2020-04-14 LAB — APTT: aPTT: 29 seconds (ref 24–36)

## 2020-04-14 LAB — SARS CORONAVIRUS 2 BY RT PCR (HOSPITAL ORDER, PERFORMED IN ~~LOC~~ HOSPITAL LAB): SARS Coronavirus 2: NEGATIVE

## 2020-04-14 LAB — ETHANOL: Alcohol, Ethyl (B): 13 mg/dL — ABNORMAL HIGH (ref <10)

## 2020-04-14 MED ORDER — ACETAMINOPHEN 325 MG PO TABS: 650.0000 mg | ORAL_TABLET | ORAL | Status: DC | PRN

## 2020-04-14 MED ORDER — ACETAMINOPHEN 650 MG RE SUPP: 650.0000 mg | RECTAL | Status: DC | PRN

## 2020-04-14 MED ORDER — STROKE: EARLY STAGES OF RECOVERY BOOK
Freq: Once | Status: AC
  Filled 2020-04-14 (×2): qty 1

## 2020-04-14 MED ORDER — ASPIRIN 300 MG RE SUPP: 300.0000 mg | Freq: Every day | RECTAL | Status: DC

## 2020-04-14 MED ORDER — ADULT MULTIVITAMIN W/MINERALS CH
1.0000 | ORAL_TABLET | Freq: Every day | ORAL | Status: DC
  Administered 2020-04-15 – 2020-04-19 (×5): 1 via ORAL
  Filled 2020-04-14 (×5): qty 1

## 2020-04-14 MED ORDER — ACETAMINOPHEN 160 MG/5ML PO SOLN: 650.0000 mg | ORAL | Status: DC | PRN

## 2020-04-14 MED ORDER — IOHEXOL 350 MG/ML SOLN
75.0000 mL | Freq: Once | INTRAVENOUS | Status: AC | PRN
  Administered 2020-04-14: 75 mL via INTRAVENOUS

## 2020-04-14 MED ORDER — ENOXAPARIN SODIUM 40 MG/0.4ML ~~LOC~~ SOLN
40.0000 mg | SUBCUTANEOUS | Status: DC
  Administered 2020-04-15 – 2020-04-18 (×5): 40 mg via SUBCUTANEOUS
  Filled 2020-04-14 (×5): qty 0.4

## 2020-04-14 MED ORDER — ASPIRIN 325 MG PO TABS: 325.0000 mg | ORAL_TABLET | Freq: Every day | ORAL | Status: DC

## 2020-04-14 NOTE — ED Notes (Signed)
Pt transported to CT ?

## 2020-04-14 NOTE — H&P (Signed)
History and Physical    Samuel George [id number] DOB: [birth year] DOA: 04/14/2020  PCP: London Pepper, MD  Patient coming from: Home  I have personally briefly reviewed patient's old medical records in [place]  Chief Complaint: Stroke symptoms  HPI: Samuel George is a 69 y.o. male with medical history significant of HTN diagnosed in Dec.  Pt presents to ED with episode of slurred speech and R facial droop.  Occurred at 556pm today.  Also associated lack of coordination with hands.  Wife described speech as gibberish.  Symptoms spontaneously resolved within 1 min.  BP per EMS 220/140   ED Course: BP in ED running 0000000 systolic.  Symptoms remain resolved.  CTA head and neck reveals: 1) critical proximal L ICA stenosis with string sign 2) age indeterminate watershed infarcts in deep L cerebral white matter.  UA shows pyuria with > 50 WBC, large LE, but pt denies any dysuria or urinary symptoms.   Review of Systems: As per HPI, otherwise all review of systems negative.  Past Medical History:  Diagnosis Date  . Hypertension     No past surgical history on file.   reports that he has never smoked. He has never used smokeless tobacco. No history on file for alcohol and drug.  No Known Allergies  Family History  Problem Relation Age of Onset  . Hypertension Mother      Prior to Admission medications   Medication Sig Start Date End Date Taking? Authorizing Provider  amLODipine (NORVASC) 10 MG tablet Take 1 tablet (10 mg total) by mouth daily. 01/10/20  Yes Pixie Casino, MD  aspirin EC 81 MG tablet Take 81 mg by mouth daily.   Yes [provider]  irbesartan-hydrochlorothiazide (AVALIDE) 300-12.5 MG tablet Take 1 tablet by mouth daily. 03/08/20  Yes Hilty, Nadean Corwin, MD  Multiple Vitamin (MULTIVITAMIN WITH MINERALS) TABS tablet Take 1 tablet by mouth daily.   Yes [provider]  Nebivolol HCl (BYSTOLIC) 20 MG TABS Take 1 tablet (20 mg  total) by mouth daily. 03/19/20  Yes Skeet Latch, MD  spironolactone (ALDACTONE) 25 MG tablet Take 0.5 tablets (12.5 mg total) by mouth daily. 04/04/20 07/03/20 Yes Skeet Latch, MD    Physical Exam: Vitals:   04/14/20 2130 04/14/20 2145 04/14/20 2200 04/14/20 2215  BP: (!) 168/77 (!) 156/78 (!) 164/74 (!) 151/75  Pulse: 65 (!) 59 (!) 59 (!) 56  Resp: 19 19 16 16   Temp:      TempSrc:      SpO2: 99% 99% 99% 99%  Weight:      Height:        Constitutional: NAD, calm, comfortable Eyes: PERRL, lids and conjunctivae normal ENMT: Mucous membranes are moist. Posterior pharynx clear of any exudate or lesions.Normal dentition.  Neck: normal, supple, no masses, no thyromegaly Respiratory: clear to auscultation bilaterally, no wheezing, no crackles. Normal respiratory effort. No accessory muscle use.  Cardiovascular: Regular rate and rhythm, no murmurs / rubs / gallops. No extremity edema. 2+ pedal pulses. No carotid bruits.  Abdomen: no tenderness, no masses palpated. No hepatosplenomegaly. Bowel sounds positive.  Musculoskeletal: no clubbing / cyanosis. No joint deformity upper and lower extremities. Good ROM, no contractures. Normal muscle tone.  Skin: no rashes, lesions, ulcers. No induration Neurologic: CN 2-12 grossly intact. Sensation intact, DTR normal. Strength 5/5 in all 4.  Psychiatric: Normal judgment and insight. Alert and oriented x 3. Normal mood.    Labs on Admission: I have personally reviewed following  labs and imaging studies  CBC: Recent Labs  Lab 04/14/20 1938 04/14/20 2003  WBC 4.5  --   NEUTROABS 2.8  --   HGB 14.2 14.3  HCT 43.5 42.0  MCV 96.5  --   PLT 209  --    Basic Metabolic Panel: Recent Labs  Lab 04/14/20 1938 04/14/20 2003  NA 136 137  K 4.4 4.0  CL 105 107  CO2 20*  --   GLUCOSE 93 90  BUN 30* 31*  CREATININE 1.51* 1.50*  CALCIUM 9.0  --    GFR: Estimated Creatinine Clearance: 55.6 mL/min (A) (by C-G formula based on SCr of 1.5  mg/dL (H)). Liver Function Tests: Recent Labs  Lab 04/14/20 1938  AST 20  ALT 25  ALKPHOS 50  BILITOT 0.4  PROT 7.7  ALBUMIN 4.0   No results for input(s): LIPASE, AMYLASE in the last 168 hours. No results for input(s): AMMONIA in the last 168 hours. Coagulation Profile: Recent Labs  Lab 04/14/20 1938 04/14/20 2040  INR SPECIMEN CLOTTED 1.0   Cardiac Enzymes: No results for input(s): CKTOTAL, CKMB, CKMBINDEX, TROPONINI in the last 168 hours. BNP (last 3 results) No results for input(s): PROBNP in the last 8760 hours. HbA1C: No results for input(s): HGBA1C in the last 72 hours. CBG: No results for input(s): GLUCAP in the last 168 hours. Lipid Profile: No results for input(s): CHOL, HDL, LDLCALC, TRIG, CHOLHDL, LDLDIRECT in the last 72 hours. Thyroid Function Tests: No results for input(s): TSH, T4TOTAL, FREET4, T3FREE, THYROIDAB in the last 72 hours. Anemia Panel: No results for input(s): VITAMINB12, FOLATE, FERRITIN, TIBC, IRON, RETICCTPCT in the last 72 hours. Urine analysis:    Component Value Date/Time   COLORURINE STRAW (A) 04/14/2020 1938   APPEARANCEUR HAZY (A) 04/14/2020 1938   LABSPEC 1.008 04/14/2020 1938   PHURINE 5.0 04/14/2020 1938   GLUCOSEU NEGATIVE 04/14/2020 1938   HGBUR NEGATIVE 04/14/2020 1938   BILIRUBINUR NEGATIVE 04/14/2020 1938   KETONESUR NEGATIVE 04/14/2020 1938   PROTEINUR NEGATIVE 04/14/2020 1938   NITRITE NEGATIVE 04/14/2020 1938   LEUKOCYTESUR LARGE (A) 04/14/2020 1938    Radiological Exams on Admission: CT Angio Head W or Wo Contrast  Result Date: 04/14/2020 CLINICAL DATA:  Transient right facial droop, aphasia, and ataxia. EXAM: CT ANGIOGRAPHY HEAD AND NECK TECHNIQUE: Multidetector CT imaging of the head and neck was performed using the standard protocol during bolus administration of intravenous contrast. Multiplanar CT image reconstructions and MIPs were obtained to evaluate the vascular anatomy. Carotid stenosis measurements (when  applicable) are obtained utilizing NASCET criteria, using the distal internal carotid diameter as the denominator. CONTRAST:  38mL OMNIPAQUE IOHEXOL 350 MG/ML SOLN COMPARISON:  None. FINDINGS: CT HEAD FINDINGS Brain: No acute cortically based infarct, intracranial hemorrhage, mass, midline shift, or extra-axial fluid collection is identified. There is asymmetric hypoattenuation in the left frontoparietal white matter at the level of the centrum semiovale and corona radiata with several subcentimeter discrete hypodense foci compatible with small infarcts of indeterminate age. Vascular: Calcified atherosclerosis at the skull base. No hyperdense vessel. Skull: No fracture or suspicious osseous lesion. Sinuses: Moderate, partially polypoid mucosal thickening in the paranasal sinuses. Clear mastoid air cells. Orbits: Unremarkable. Review of the MIP images confirms the above findings CTA NECK FINDINGS Aortic arch: Standard 3 vessel aortic arch with widely patent arch vessel origins. Right carotid system: Patent with mild, predominantly calcified plaque at the carotid bifurcation. No evidence of significant stenosis or dissection. Left carotid system: Widely patent common carotid artery.  Calcified and soft plaque at the carotid bifurcation. Soft plaque in the proximal ICA resulting in a nearly occlusive stenosis (radiographic string sign). Patent but diffusely small cervical ICA more distally consistent with reduced flow. Vertebral arteries: Patent and small bilateral. No significant stenosis or dissection identified although venous contrast limits assessment of the left V1 segment. 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 Possible central disc protrusion and moderate to severe spinal stenosis at C6-7. Other neck: No evidence of cervical lymphadenopathy or mass. Upper chest: Clear lung apices. Review of the MIP images confirms the above findings CTA HEAD FINDINGS Anterior circulation: The internal carotid  arteries are patent from skull base to carotid termini without significant stenosis on the right. Calcified plaque results in mild proximal right supraclinoid stenosis. The left A1 segment is hypoplastic. ACAs and MCAs are patent without evidence of a significant A1 or M1 stenosis. The branch vessels are suboptimally evaluated due to image noise resulting in a diffusely irregular and attenuated appearance which is mildly greater involving the left MCA branches, however no convincing proximal branch occlusion is identified. No aneurysm is identified. Posterior circulation: The intracranial vertebral arteries are patent to the basilar. Patent left PICA, right AICA, and bilateral SCA origins are identified. The basilar artery is patent and congenitally small in caliber diffusely. There are fetal origins of the PCAs. Both PCAs are patent without evidence of a flow limiting proximal stenosis. No aneurysm is identified. Venous sinuses: Not well evaluated due to contrast timing. Anatomic variants: Hypoplastic vertebrobasilar circulation and left A1 segment. Review of the MIP images confirms the above findings IMPRESSION: 1. Critical proximal left ICA stenosis (radiographic string sign) with age indeterminate infarcts in the deep left cerebral white matter (watershed ischemia). 2. Mild proximal left supraclinoid ICA stenosis. 3. Hypoplastic vertebrobasilar circulation. 4. Aortic Atherosclerosis (ICD10-I70.0). Electronically Signed   By: Logan Bores M.D.   On: 04/14/2020 21:03   CT Angio Neck W and/or Wo Contrast  Result Date: 04/14/2020 CLINICAL DATA:  Transient right facial droop, aphasia, and ataxia. EXAM: CT ANGIOGRAPHY HEAD AND NECK TECHNIQUE: Multidetector CT imaging of the head and neck was performed using the standard protocol during bolus administration of intravenous contrast. Multiplanar CT image reconstructions and MIPs were obtained to evaluate the vascular anatomy. Carotid stenosis measurements (when  applicable) are obtained utilizing NASCET criteria, using the distal internal carotid diameter as the denominator. CONTRAST:  65mL OMNIPAQUE IOHEXOL 350 MG/ML SOLN COMPARISON:  None. FINDINGS: CT HEAD FINDINGS Brain: No acute cortically based infarct, intracranial hemorrhage, mass, midline shift, or extra-axial fluid collection is identified. There is asymmetric hypoattenuation in the left frontoparietal white matter at the level of the centrum semiovale and corona radiata with several subcentimeter discrete hypodense foci compatible with small infarcts of indeterminate age. Vascular: Calcified atherosclerosis at the skull base. No hyperdense vessel. Skull: No fracture or suspicious osseous lesion. Sinuses: Moderate, partially polypoid mucosal thickening in the paranasal sinuses. Clear mastoid air cells. Orbits: Unremarkable. Review of the MIP images confirms the above findings CTA NECK FINDINGS Aortic arch: Standard 3 vessel aortic arch with widely patent arch vessel origins. Right carotid system: Patent with mild, predominantly calcified plaque at the carotid bifurcation. No evidence of significant stenosis or dissection. Left carotid system: Widely patent common carotid artery. Calcified and soft plaque at the carotid bifurcation. Soft plaque in the proximal ICA resulting in a nearly occlusive stenosis (radiographic string sign). Patent but diffusely small cervical ICA more distally consistent with reduced flow. Vertebral arteries: Patent and small  bilateral. No significant stenosis or dissection identified although venous contrast limits assessment of the left V1 segment. 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 Possible central disc protrusion and moderate to severe spinal stenosis at C6-7. Other neck: No evidence of cervical lymphadenopathy or mass. Upper chest: Clear lung apices. Review of the MIP images confirms the above findings CTA HEAD FINDINGS Anterior circulation: The internal carotid  arteries are patent from skull base to carotid termini without significant stenosis on the right. Calcified plaque results in mild proximal right supraclinoid stenosis. The left A1 segment is hypoplastic. ACAs and MCAs are patent without evidence of a significant A1 or M1 stenosis. The branch vessels are suboptimally evaluated due to image noise resulting in a diffusely irregular and attenuated appearance which is mildly greater involving the left MCA branches, however no convincing proximal branch occlusion is identified. No aneurysm is identified. Posterior circulation: The intracranial vertebral arteries are patent to the basilar. Patent left PICA, right AICA, and bilateral SCA origins are identified. The basilar artery is patent and congenitally small in caliber diffusely. There are fetal origins of the PCAs. Both PCAs are patent without evidence of a flow limiting proximal stenosis. No aneurysm is identified. Venous sinuses: Not well evaluated due to contrast timing. Anatomic variants: Hypoplastic vertebrobasilar circulation and left A1 segment. Review of the MIP images confirms the above findings IMPRESSION: 1. Critical proximal left ICA stenosis (radiographic string sign) with age indeterminate infarcts in the deep left cerebral white matter (watershed ischemia). 2. Mild proximal left supraclinoid ICA stenosis. 3. Hypoplastic vertebrobasilar circulation. 4. Aortic Atherosclerosis (ICD10-I70.0). Electronically Signed   By: Logan Bores M.D.   On: 04/14/2020 21:03    EKG: Independently reviewed.  Assessment/Plan Principal Problem:   Cerebral infarction, watershed distribution, unilateral, acute ([place]) Active Problems:   Essential hypertension   Left carotid stenosis   Acute ischemic stroke (HCC)   CKD (chronic kidney disease) stage 3, GFR 30-59 ml/min    1. TIA vs acute stroke - R sided body symptoms due to L sided cerebral event. 1. With evidence of age indeterminate watershed infarcts on the  left and critical proximal L ICA stenosis with string sign on CTA head and neck! 2. Patient needs a L CEA 3. Stroke pathway 4. MRI brain 5. PT/OT/SLP 6. Tele monitor 7. ASA 325 for now 8. Spoke briefly with Dr. Lorraine Lax: 1. Allow permissive HTN 2. Agrees patient needs CEA 9. Call [place] surgeon in AM. 2. HTN - 1. Holding all home BP meds to allow permissive HTN 2. Treat if BP gets above [id number] systolic or becomes symptomatic 3. Pyuria - no UTI symptoms 1. UCx pending 2. Holding off on ABx for the moment 4. CKD stage 3a - 1. Creat 1.5 today, looks stable  DVT prophylaxis: Lovenox Code Status: Full Family Communication: Family at bedside, did discuss with pt and family our findings, and that I suspected he likely would need CEA surgery. Disposition Plan: Home after carotid endarterectomy Consults called: Neurology, call Vasc surgery in AM Admission status: Admit to inpatient  Severity of Illness: The appropriate patient status for this patient is INPATIENT. Inpatient status is judged to be reasonable and necessary in order to provide the required intensity of service to ensure the patient's safety. The patient's presenting symptoms, physical exam findings, and initial radiographic and laboratory data in the context of their chronic comorbidities is felt to place them at high risk for further clinical deterioration. Furthermore, it is not anticipated that the patient will be  medically stable for discharge from the hospital within 2 midnights of admission. The following factors support the patient status of inpatient.   IP status as this patient fairly obviously needs a left carotid endarterectomy!   * I certify that at the point of admission it is my clinical judgment that the patient will require inpatient hospital care spanning beyond 2 midnights from the point of admission due to high intensity of service, high risk for further deterioration and high frequency of surveillance  required.*    Arlando Leisinger M. DO [facility]  How to contact the [facility] Attending or Consulting provider [place] or covering provider during after hours [place], for this patient?  1. Check the care team in [facility] and look for a) attending/consulting [facility] provider listed and b) the [facility] team listed 2. Log into [email or web address]  Amion Physician Scheduling and messaging for groups and whole hospitals  On call and physician scheduling software for group practices, residents, hospitalists and other medical providers for call, clinic, rotation and shift schedules. On[employer] is a hospital-wide system for scheduling doctors and paging doctors on call. EasyPlot is for scientific plotting and data analysis.  [email or web address]  and use [place]'s universal password to access. If you do not have the password, please contact the hospital operator.  3. Locate the [facility] provider you are looking for under [facility] and page to a number that you can be directly reached. 4. If you still have difficulty reaching the provider, please page the [facility] (Director on Call) for the Hospitalists listed on amion for assistance.  04/14/2020, 10:34 PM

## 2020-04-14 NOTE — ED Triage Notes (Signed)
Pt here via EMS-dx with hypertension in December, 45-60 sec episodes of slurred speech and R facial droop. Bp on EMS arrival 220/140. Denies headache, n/v. Stroke screen clear per EMS-ambulatory. BP improved without intervention.

## 2020-04-14 NOTE — Consult Note (Signed)
Requesting Physician: Dr. Ralene Bathe    Chief Complaint: Aphasia, right facial droop  History obtained from: Patient and Chart   HPI:                                                                                                                                       Samuel George is a 69 y.o. male with past medical history significant for hypertension presents to the emergency department with sudden onset speech difficulty and incoordination along with facial droop that lasted for approximately 1 minute.  At 5:46 PM today patient was attempting to clean out his daughters when he became suddenly incoordinated and unable to use his hands.  His wife noticed patient had garbled speech as well as a right facial droop.  Symptoms resolves spontaneously after about 1 minute.  Wife also states that he has been having progressive cognitive decline over the last few months.  On arrival to [facility], ER patient was back to his baseline therefore no code stroke was called.  Stat CT head was obtained which showed subacute infarcts in the watershed territory in the left hemisphere as well as CT angiogram showing high-grade left ICA stenosis   Date last known well: 5.16-21 Time last known well: 5:45 PM tPA Given: no, symptoms resolved  NIHSS: 0 Baseline MRS 0   Past Medical History:  Diagnosis Date  . Hypertension     No past surgical history on file.  Family History  Problem Relation Age of Onset  . Hypertension Mother    Social History:  reports that he has never smoked. He has never used smokeless tobacco. No history on file for alcohol and drug.  Allergies: No Known Allergies  Medications:                                                                                                                        I reviewed home medications   ROS:  14 systems reviewed  and negative except above    Examination:                                                                                                      General: Appears well-developed  Psych: Affect appropriate to situation Eyes: No scleral injection HENT: No OP obstrucion Head: Normocephalic.  Cardiovascular: Normal rate and regular rhythm.  Respiratory: Effort normal and breath sounds normal to anterior ascultation GI: Soft.  No distension. There is no tenderness.  Skin: WDI    Neurological Examination Mental Status: Alert, oriented, thought content appropriate.  Speech fluent without evidence of aphasia. Able to follow 3 step commands without difficulty. Cranial Nerves: II: Visual fields grossly normal,  III,IV, VI: ptosis not present, extra-ocular motions intact bilaterally, pupils equal, round, reactive to light and accommodation V,VII: smile symmetric, facial light touch sensation normal bilaterally VIII: hearing normal bilaterally IX,X: uvula rises symmetrically XI: bilateral shoulder shrug XII: midline tongue extension Motor: Right : Upper extremity   5/5    Left:     Upper extremity   5/5  Lower extremity   5/5     Lower extremity   5/5 Tone and bulk:normal tone throughout; no atrophy noted Sensory: Pinprick and light touch intact throughout, bilaterally Deep Tendon Reflexes: 2+ and symmetric throughout Plantars: Right: downgoing   Left: downgoing Cerebellar: normal finger-to-nose, normal rapid alternating movements and normal heel-to-shin test Gait: normal gait and station     Lab Results: Basic Metabolic Panel: Recent Labs  Lab 04/14/20 1938 04/14/20 2003  NA 136 137  K 4.4 4.0  CL 105 107  CO2 20*  --   GLUCOSE 93 90  BUN 30* 31*  CREATININE 1.51* 1.50*  CALCIUM 9.0  --     CBC: Recent Labs  Lab 04/14/20 1938 04/14/20 2003  WBC 4.5  --   NEUTROABS 2.8  --   HGB 14.2 14.3  HCT 43.5 42.0  MCV 96.5  --   PLT 209  --     Coagulation  Studies: Recent Labs    04/14/20 1938 04/14/20 2040  LABPROT SPECIMEN CLOTTED 12.8  INR SPECIMEN CLOTTED 1.0    Imaging: CT Angio Head W or Wo Contrast  Result Date: 04/14/2020 CLINICAL DATA:  Transient right facial droop, aphasia, and ataxia. EXAM: CT ANGIOGRAPHY HEAD AND NECK TECHNIQUE: Multidetector CT imaging of the head and neck was performed using the standard protocol during bolus administration of intravenous contrast. Multiplanar CT image reconstructions and MIPs were obtained to evaluate the vascular anatomy. Carotid stenosis measurements (when applicable) are obtained utilizing NASCET criteria, using the distal internal carotid diameter as the denominator. CONTRAST:  68mL OMNIPAQUE IOHEXOL 350 MG/ML SOLN COMPARISON:  None. FINDINGS: CT HEAD FINDINGS Brain: No acute cortically based infarct, intracranial hemorrhage, mass, midline shift, or extra-axial fluid collection is identified. There is asymmetric hypoattenuation in the left frontoparietal white matter at the level of the centrum semiovale and corona radiata with several subcentimeter discrete hypodense foci compatible with small infarcts of indeterminate age. Vascular: Calcified atherosclerosis at the skull base. No  hyperdense vessel. Skull: No fracture or suspicious osseous lesion. Sinuses: Moderate, partially polypoid mucosal thickening in the paranasal sinuses. Clear mastoid air cells. Orbits: Unremarkable. Review of the MIP images confirms the above findings CTA NECK FINDINGS Aortic arch: Standard 3 vessel aortic arch with widely patent arch vessel origins. Right carotid system: Patent with mild, predominantly calcified plaque at the carotid bifurcation. No evidence of significant stenosis or dissection. Left carotid system: Widely patent common carotid artery. Calcified and soft plaque at the carotid bifurcation. Soft plaque in the proximal ICA resulting in a nearly occlusive stenosis (radiographic string sign). Patent but diffusely  small cervical ICA more distally consistent with reduced flow. Vertebral arteries: Patent and small bilateral. No significant stenosis or dissection identified although venous contrast limits assessment of the left V1 segment. 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 Possible central disc protrusion and moderate to severe spinal stenosis at C6-7. Other neck: No evidence of cervical lymphadenopathy or mass. Upper chest: Clear lung apices. Review of the MIP images confirms the above findings CTA HEAD FINDINGS Anterior circulation: The internal carotid arteries are patent from skull base to carotid termini without significant stenosis on the right. Calcified plaque results in mild proximal right supraclinoid stenosis. The left A1 segment is hypoplastic. ACAs and MCAs are patent without evidence of a significant A1 or M1 stenosis. The branch vessels are suboptimally evaluated due to image noise resulting in a diffusely irregular and attenuated appearance which is mildly greater involving the left MCA branches, however no convincing proximal branch occlusion is identified. No aneurysm is identified. Posterior circulation: The intracranial vertebral arteries are patent to the basilar. Patent left PICA, right AICA, and bilateral SCA origins are identified. The basilar artery is patent and congenitally small in caliber diffusely. There are fetal origins of the PCAs. Both PCAs are patent without evidence of a flow limiting proximal stenosis. No aneurysm is identified. Venous sinuses: Not well evaluated due to contrast timing. Anatomic variants: Hypoplastic vertebrobasilar circulation and left A1 segment. Review of the MIP images confirms the above findings IMPRESSION: 1. Critical proximal left ICA stenosis (radiographic string sign) with age indeterminate infarcts in the deep left cerebral white matter (watershed ischemia). 2. Mild proximal left supraclinoid ICA stenosis. 3. Hypoplastic vertebrobasilar  circulation. 4. Aortic Atherosclerosis (ICD10-I70.0). Electronically Signed   By: Logan Bores M.D.   On: 04/14/2020 21:03   DG Chest 2 View  Result Date: 04/14/2020 CLINICAL DATA:  Stroke. EXAM: CHEST - 2 VIEW COMPARISON:  None. FINDINGS: The cardiomediastinal contours are normal. The lungs are clear. Pulmonary vasculature is normal. No consolidation, pleural effusion, or pneumothorax. No acute osseous abnormalities are seen. Mild degenerative change in the spine. IMPRESSION: No acute chest findings. Electronically Signed   By: Keith Rake M.D.   On: 04/14/2020 22:42   CT Angio Neck W and/or Wo Contrast  Result Date: 04/14/2020 CLINICAL DATA:  Transient right facial droop, aphasia, and ataxia. EXAM: CT ANGIOGRAPHY HEAD AND NECK TECHNIQUE: Multidetector CT imaging of the head and neck was performed using the standard protocol during bolus administration of intravenous contrast. Multiplanar CT image reconstructions and MIPs were obtained to evaluate the vascular anatomy. Carotid stenosis measurements (when applicable) are obtained utilizing NASCET criteria, using the distal internal carotid diameter as the denominator. CONTRAST:  66mL OMNIPAQUE IOHEXOL 350 MG/ML SOLN COMPARISON:  None. FINDINGS: CT HEAD FINDINGS Brain: No acute cortically based infarct, intracranial hemorrhage, mass, midline shift, or extra-axial fluid collection is identified. There is asymmetric hypoattenuation in the left frontoparietal  white matter at the level of the centrum semiovale and corona radiata with several subcentimeter discrete hypodense foci compatible with small infarcts of indeterminate age. Vascular: Calcified atherosclerosis at the skull base. No hyperdense vessel. Skull: No fracture or suspicious osseous lesion. Sinuses: Moderate, partially polypoid mucosal thickening in the paranasal sinuses. Clear mastoid air cells. Orbits: Unremarkable. Review of the MIP images confirms the above findings CTA NECK FINDINGS Aortic  arch: Standard 3 vessel aortic arch with widely patent arch vessel origins. Right carotid system: Patent with mild, predominantly calcified plaque at the carotid bifurcation. No evidence of significant stenosis or dissection. Left carotid system: Widely patent common carotid artery. Calcified and soft plaque at the carotid bifurcation. Soft plaque in the proximal ICA resulting in a nearly occlusive stenosis (radiographic string sign). Patent but diffusely small cervical ICA more distally consistent with reduced flow. Vertebral arteries: Patent and small bilateral. No significant stenosis or dissection identified although venous contrast limits assessment of the left V1 segment. 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 Possible central disc protrusion and moderate to severe spinal stenosis at C6-7. Other neck: No evidence of cervical lymphadenopathy or mass. Upper chest: Clear lung apices. Review of the MIP images confirms the above findings CTA HEAD FINDINGS Anterior circulation: The internal carotid arteries are patent from skull base to carotid termini without significant stenosis on the right. Calcified plaque results in mild proximal right supraclinoid stenosis. The left A1 segment is hypoplastic. ACAs and MCAs are patent without evidence of a significant A1 or M1 stenosis. The branch vessels are suboptimally evaluated due to image noise resulting in a diffusely irregular and attenuated appearance which is mildly greater involving the left MCA branches, however no convincing proximal branch occlusion is identified. No aneurysm is identified. Posterior circulation: The intracranial vertebral arteries are patent to the basilar. Patent left PICA, right AICA, and bilateral SCA origins are identified. The basilar artery is patent and congenitally small in caliber diffusely. There are fetal origins of the PCAs. Both PCAs are patent without evidence of a flow limiting proximal stenosis. No aneurysm is  identified. Venous sinuses: Not well evaluated due to contrast timing. Anatomic variants: Hypoplastic vertebrobasilar circulation and left A1 segment. Review of the MIP images confirms the above findings IMPRESSION: 1. Critical proximal left ICA stenosis (radiographic string sign) with age indeterminate infarcts in the deep left cerebral white matter (watershed ischemia). 2. Mild proximal left supraclinoid ICA stenosis. 3. Hypoplastic vertebrobasilar circulation. 4. Aortic Atherosclerosis (ICD10-I70.0). Electronically Signed   By: Logan Bores M.D.   On: 04/14/2020 21:03     ASSESSMENT AND PLAN    Transient ischemic attack versus acute left hemispheric infarction Subacute left watershed infarcts Symptomatic left carotid stenosis  Recommendations #Vascular surgery consult tomorrow morning for urgent CEA (urgent, not emergent) # MRI of the brain without contrast #Transthoracic Echo  # Start patient on ASA 325mg  daily #Start or continue Atorvastatin 80 mg/other high intensity statin # BP goal: permissive HTN upto 220/120 mmHg # HBAIC and Lipid profile # Telemetry monitoring # Frequent neuro checks # Stroke swallow screen  Please page stroke NP  Or  PA  Or MD from 8am -4 pm  as this patient from this time will be  followed by the stroke.   You can look them up on [email or web address]  [facility]   Sulo Janczak [facility] Pager Number [phone]

## 2020-04-14 NOTE — ED Provider Notes (Signed)
[facility] EMERGENCY DEPARTMENT Provider Note   CSN: [id number] Arrival date & time: 04/14/20  1825     History No chief complaint on file.   Samuel George is a 69 y.o. male.  HPI Samuel George is a 69 y.o. male who presents to the Emergency Department complaining of altered mental status. History is provided by the patient and his wife. He presents the emergency department by EMS for evaluation of change in mental status and speech. At 546 pm today he was attempting to clean out a straw and he became uncoordinated was a unable to bring his hands together. He also had aphasia at that time. His wife described his speech as gibberish. She also notes that he had some right sided facial droop. His symptoms spontaneously resolved within one minute. He has started on medications for high blood pressure since December. No additional medical problems. No similar symptoms. He does not smoke tobacco or use drugs. He does drink about 3 to 4 beers daily. He has had one beer today.    Past Medical History:  Diagnosis Date  . Hypertension     Patient Active Problem List   Diagnosis Date Noted  . Left carotid stenosis 04/14/2020  . Cerebral infarction, watershed distribution, unilateral, acute ([place]) 04/14/2020  . Acute ischemic stroke ([place]) 04/14/2020  . CKD (chronic kidney disease) stage 3, GFR 30-59 ml/min 04/14/2020  . Essential hypertension 02/07/2020  . PVC's (premature ventricular contractions) 02/07/2020    No past surgical history on file.     Family History  Problem Relation Age of Onset  . Hypertension Mother     Social History   Tobacco Use  . Smoking status: Never Smoker  . Smokeless tobacco: Never Used  Substance Use Topics  . Alcohol use: Not on file  . Drug use: Not on file    Home Medications Prior to Admission medications   Medication Sig Start Date End Date Taking? Authorizing Provider  amLODipine (NORVASC) 10 MG tablet Take 1 tablet (10  mg total) by mouth daily. 01/10/20  Yes Pixie Casino, MD  aspirin EC 81 MG tablet Take 81 mg by mouth daily.   Yes [provider]  irbesartan-hydrochlorothiazide (AVALIDE) 300-12.5 MG tablet Take 1 tablet by mouth daily. 03/08/20  Yes Hilty, Nadean Corwin, MD  Multiple Vitamin (MULTIVITAMIN WITH MINERALS) TABS tablet Take 1 tablet by mouth daily.   Yes [provider]  Nebivolol HCl (BYSTOLIC) 20 MG TABS Take 1 tablet (20 mg total) by mouth daily. 03/19/20  Yes Skeet Latch, MD  spironolactone (ALDACTONE) 25 MG tablet Take 0.5 tablets (12.5 mg total) by mouth daily. 04/04/20 07/03/20 Yes Skeet Latch, MD    Allergies    Patient has no known allergies.  Review of Systems   Review of Systems  Physical Exam Updated Vital Signs BP (!) 159/76   Pulse 60   Temp (!) 97.5 F (36.4 C) (Oral)   Resp 20   Ht 5\' 11"  (1.803 m)   Wt 98.4 kg   SpO2 99%   BMI 30.27 kg/m   Physical Exam  ED Results / Procedures / Treatments   Labs (all labs ordered are listed, but only abnormal results are displayed) Labs Reviewed  ETHANOL - Abnormal; Notable for the following components:      Result Value   Alcohol, Ethyl (B) 13 (*)    All other components within normal limits  COMPREHENSIVE METABOLIC PANEL - Abnormal; Notable for the following components:  CO2 20 (*)    BUN 30 (*)    Creatinine, Ser 1.51 (*)    GFR calc non Af Amer 46 (*)    GFR calc Af Amer 54 (*)    All other components within normal limits  URINALYSIS, ROUTINE W REFLEX MICROSCOPIC - Abnormal; Notable for the following components:   Color, Urine STRAW (*)    APPearance HAZY (*)    Leukocytes,Ua LARGE (*)    WBC, UA >50 (*)    Bacteria, UA FEW (*)    All other components within normal limits  I-STAT CHEM 8, ED - Abnormal; Notable for the following components:   BUN 31 (*)    Creatinine, Ser 1.50 (*)    All other components within normal limits  SARS CORONAVIRUS 2 BY RT PCR (HOSPITAL ORDER, [facility])  URINE CULTURE  PROTIME-INR  CBC  DIFFERENTIAL  RAPID URINE DRUG SCREEN, HOSP PERFORMED  APTT  PROTIME-INR  HEMOGLOBIN A1C  LIPID PANEL  HIV ANTIBODY (ROUTINE TESTING W REFLEX)    EKG EKG Interpretation  Date/Time:  Sunday Apr 14 2020 18:30:40 EDT Ventricular Rate:  72 PR Interval:    QRS Duration: 109 QT Interval:  408 QTC Calculation: 447 R Axis:   54 Text Interpretation: Sinus rhythm Borderline T abnormalities, diffuse leads no prior available for comparison Confirmed by Quintella Reichert [phone]) on 04/14/2020 6:32:00 PM   Radiology CT Angio Head W or Wo Contrast  Result Date: 04/14/2020 CLINICAL DATA:  Transient right facial droop, aphasia, and ataxia. EXAM: CT ANGIOGRAPHY HEAD AND NECK TECHNIQUE: Multidetector CT imaging of the head and neck was performed using the standard protocol during bolus administration of intravenous contrast. Multiplanar CT image reconstructions and MIPs were obtained to evaluate the vascular anatomy. Carotid stenosis measurements (when applicable) are obtained utilizing NASCET criteria, using the distal internal carotid diameter as the denominator. CONTRAST:  64mL OMNIPAQUE IOHEXOL 350 MG/ML SOLN COMPARISON:  None. FINDINGS: CT HEAD FINDINGS Brain: No acute cortically based infarct, intracranial hemorrhage, mass, midline shift, or extra-axial fluid collection is identified. There is asymmetric hypoattenuation in the left frontoparietal white matter at the level of the centrum semiovale and corona radiata with several subcentimeter discrete hypodense foci compatible with small infarcts of indeterminate age. Vascular: Calcified atherosclerosis at the skull base. No hyperdense vessel. Skull: No fracture or suspicious osseous lesion. Sinuses: Moderate, partially polypoid mucosal thickening in the paranasal sinuses. Clear mastoid air cells. Orbits: Unremarkable. Review of the MIP images confirms the above findings CTA NECK FINDINGS Aortic  arch: Standard 3 vessel aortic arch with widely patent arch vessel origins. Right carotid system: Patent with mild, predominantly calcified plaque at the carotid bifurcation. No evidence of significant stenosis or dissection. Left carotid system: Widely patent common carotid artery. Calcified and soft plaque at the carotid bifurcation. Soft plaque in the proximal ICA resulting in a nearly occlusive stenosis (radiographic string sign). Patent but diffusely small cervical ICA more distally consistent with reduced flow. Vertebral arteries: Patent and small bilateral. No significant stenosis or dissection identified although venous contrast limits assessment of the left V1 segment. 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 Possible central disc protrusion and moderate to severe spinal stenosis at C6-7. Other neck: No evidence of cervical lymphadenopathy or mass. Upper chest: Clear lung apices. Review of the MIP images confirms the above findings CTA HEAD FINDINGS Anterior circulation: The internal carotid arteries are patent from skull base to carotid termini without significant stenosis on the right. Calcified  plaque results in mild proximal right supraclinoid stenosis. The left A1 segment is hypoplastic. ACAs and MCAs are patent without evidence of a significant A1 or M1 stenosis. The branch vessels are suboptimally evaluated due to image noise resulting in a diffusely irregular and attenuated appearance which is mildly greater involving the left MCA branches, however no convincing proximal branch occlusion is identified. No aneurysm is identified. Posterior circulation: The intracranial vertebral arteries are patent to the basilar. Patent left PICA, right AICA, and bilateral SCA origins are identified. The basilar artery is patent and congenitally small in caliber diffusely. There are fetal origins of the PCAs. Both PCAs are patent without evidence of a flow limiting proximal stenosis. No aneurysm is  identified. Venous sinuses: Not well evaluated due to contrast timing. Anatomic variants: Hypoplastic vertebrobasilar circulation and left A1 segment. Review of the MIP images confirms the above findings IMPRESSION: 1. Critical proximal left ICA stenosis (radiographic string sign) with age indeterminate infarcts in the deep left cerebral white matter (watershed ischemia). 2. Mild proximal left supraclinoid ICA stenosis. 3. Hypoplastic vertebrobasilar circulation. 4. Aortic Atherosclerosis (ICD10-I70.0). Electronically Signed   By: Logan Bores M.D.   On: 04/14/2020 21:03   DG Chest 2 View  Result Date: 04/14/2020 CLINICAL DATA:  Stroke. EXAM: CHEST - 2 VIEW COMPARISON:  None. FINDINGS: The cardiomediastinal contours are normal. The lungs are clear. Pulmonary vasculature is normal. No consolidation, pleural effusion, or pneumothorax. No acute osseous abnormalities are seen. Mild degenerative change in the spine. IMPRESSION: No acute chest findings. Electronically Signed   By: Keith Rake M.D.   On: 04/14/2020 22:42   CT Angio Neck W and/or Wo Contrast  Result Date: 04/14/2020 CLINICAL DATA:  Transient right facial droop, aphasia, and ataxia. EXAM: CT ANGIOGRAPHY HEAD AND NECK TECHNIQUE: Multidetector CT imaging of the head and neck was performed using the standard protocol during bolus administration of intravenous contrast. Multiplanar CT image reconstructions and MIPs were obtained to evaluate the vascular anatomy. Carotid stenosis measurements (when applicable) are obtained utilizing NASCET criteria, using the distal internal carotid diameter as the denominator. CONTRAST:  58mL OMNIPAQUE IOHEXOL 350 MG/ML SOLN COMPARISON:  None. FINDINGS: CT HEAD FINDINGS Brain: No acute cortically based infarct, intracranial hemorrhage, mass, midline shift, or extra-axial fluid collection is identified. There is asymmetric hypoattenuation in the left frontoparietal white matter at the level of the centrum semiovale  and corona radiata with several subcentimeter discrete hypodense foci compatible with small infarcts of indeterminate age. Vascular: Calcified atherosclerosis at the skull base. No hyperdense vessel. Skull: No fracture or suspicious osseous lesion. Sinuses: Moderate, partially polypoid mucosal thickening in the paranasal sinuses. Clear mastoid air cells. Orbits: Unremarkable. Review of the MIP images confirms the above findings CTA NECK FINDINGS Aortic arch: Standard 3 vessel aortic arch with widely patent arch vessel origins. Right carotid system: Patent with mild, predominantly calcified plaque at the carotid bifurcation. No evidence of significant stenosis or dissection. Left carotid system: Widely patent common carotid artery. Calcified and soft plaque at the carotid bifurcation. Soft plaque in the proximal ICA resulting in a nearly occlusive stenosis (radiographic string sign). Patent but diffusely small cervical ICA more distally consistent with reduced flow. Vertebral arteries: Patent and small bilateral. No significant stenosis or dissection identified although venous contrast limits assessment of the left V1 segment. 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 Possible central disc protrusion and moderate to severe spinal stenosis at C6-7. Other neck: No evidence of cervical lymphadenopathy or mass. Upper chest: Clear lung apices.  Review of the MIP images confirms the above findings CTA HEAD FINDINGS Anterior circulation: The internal carotid arteries are patent from skull base to carotid termini without significant stenosis on the right. Calcified plaque results in mild proximal right supraclinoid stenosis. The left A1 segment is hypoplastic. ACAs and MCAs are patent without evidence of a significant A1 or M1 stenosis. The branch vessels are suboptimally evaluated due to image noise resulting in a diffusely irregular and attenuated appearance which is mildly greater involving the left MCA  branches, however no convincing proximal branch occlusion is identified. No aneurysm is identified. Posterior circulation: The intracranial vertebral arteries are patent to the basilar. Patent left PICA, right AICA, and bilateral SCA origins are identified. The basilar artery is patent and congenitally small in caliber diffusely. There are fetal origins of the PCAs. Both PCAs are patent without evidence of a flow limiting proximal stenosis. No aneurysm is identified. Venous sinuses: Not well evaluated due to contrast timing. Anatomic variants: Hypoplastic vertebrobasilar circulation and left A1 segment. Review of the MIP images confirms the above findings IMPRESSION: 1. Critical proximal left ICA stenosis (radiographic string sign) with age indeterminate infarcts in the deep left cerebral white matter (watershed ischemia). 2. Mild proximal left supraclinoid ICA stenosis. 3. Hypoplastic vertebrobasilar circulation. 4. Aortic Atherosclerosis (ICD10-I70.0). Electronically Signed   By: Logan Bores M.D.   On: 04/14/2020 21:03    Procedures Procedures (including critical care time)  Medications Ordered in ED Medications   stroke: mapping our early stages of recovery book (has no administration in time range)  acetaminophen (TYLENOL) tablet 650 mg (has no administration in time range)    Or  acetaminophen (TYLENOL) 160 MG/5ML solution 650 mg (has no administration in time range)    Or  acetaminophen (TYLENOL) suppository 650 mg (has no administration in time range)  aspirin suppository 300 mg (has no administration in time range)    Or  aspirin tablet 325 mg (has no administration in time range)  enoxaparin (LOVENOX) injection 40 mg (has no administration in time range)  multivitamin with minerals tablet 1 tablet (has no administration in time range)  iohexol (OMNIPAQUE) 350 MG/ML injection 75 mL (75 mLs Intravenous Contrast Given 04/14/20 2020)    ED Course  I have reviewed the triage vital signs  and the nursing notes.  Pertinent labs & imaging results that were available during my care of the patient were reviewed by me and considered in my medical decision making (see chart for details).    MDM Rules/Calculators/A&P                     Patient here for evaluation of transient episode of altered mental status that occurred around 545 today. He is back at his neurologic baseline with no focal neurologic deficits. CTA obtained given concerning history and does demonstrate critical ICA stenosis. Discussed the patient and findings with Dr. Malen Gauze with neurology, who will see the patient and consult. Hospitalist consulted for admission. Patient and wife updated findings of studies and recommendation for admission and they are in agreement with treatment plan.  Final Clinical Impression(s) / ED Diagnoses Final diagnoses:  Stroke [facility])    Rx / [place] Orders ED Discharge Orders    None       Quintella Reichert, MD 04/14/20 2350

## 2020-04-15 ENCOUNTER — Telehealth: Payer: Self-pay | Admitting: Cardiovascular Disease

## 2020-04-15 ENCOUNTER — Inpatient Hospital Stay (HOSPITAL_COMMUNITY): Payer: Medicare Other

## 2020-04-15 DIAGNOSIS — I6389 Other cerebral infarction: Secondary | ICD-10-CM

## 2020-04-15 DIAGNOSIS — I6522 Occlusion and stenosis of left carotid artery: Secondary | ICD-10-CM

## 2020-04-15 DIAGNOSIS — I639 Cerebral infarction, unspecified: Secondary | ICD-10-CM

## 2020-04-15 LAB — LIPID PANEL
Cholesterol: 186 mg/dL (ref 0–200)
HDL: 68 mg/dL (ref >40)
LDL Cholesterol: 109 mg/dL — ABNORMAL HIGH (ref 0–99)
Total CHOL/HDL Ratio: 2.7 RATIO
Triglycerides: 47 mg/dL (ref <150)
VLDL: 9 mg/dL (ref 0–40)

## 2020-04-15 LAB — HIV ANTIBODY (ROUTINE TESTING W REFLEX): HIV Screen 4th Generation wRfx: NONREACTIVE

## 2020-04-15 LAB — ECHOCARDIOGRAM COMPLETE
Height: 71 in
Weight: 3472 oz

## 2020-04-15 LAB — HEMOGLOBIN A1C
Hgb A1c MFr Bld: 5.8 % — ABNORMAL HIGH (ref 4.8–5.6)
Mean Plasma Glucose: 120 mg/dL

## 2020-04-15 MED ORDER — ATORVASTATIN CALCIUM 40 MG PO TABS
40.0000 mg | ORAL_TABLET | Freq: Every day | ORAL | Status: DC
  Administered 2020-04-15 – 2020-04-19 (×5): 40 mg via ORAL
  Filled 2020-04-15 (×5): qty 1

## 2020-04-15 MED ORDER — CLOPIDOGREL BISULFATE 75 MG PO TABS
75.0000 mg | ORAL_TABLET | Freq: Every day | ORAL | Status: DC
  Administered 2020-04-16 – 2020-04-19 (×4): 75 mg via ORAL
  Filled 2020-04-15 (×3): qty 1

## 2020-04-15 MED ORDER — CLOPIDOGREL BISULFATE 300 MG PO TABS
300.0000 mg | ORAL_TABLET | Freq: Once | ORAL | Status: AC
  Administered 2020-04-15: 300 mg via ORAL
  Filled 2020-04-15: qty 1

## 2020-04-15 MED ORDER — ASPIRIN EC 325 MG PO TBEC
325.0000 mg | DELAYED_RELEASE_TABLET | Freq: Every day | ORAL | Status: DC
  Administered 2020-04-15 – 2020-04-19 (×5): 325 mg via ORAL
  Filled 2020-04-15 (×4): qty 1

## 2020-04-15 MED ORDER — CLOPIDOGREL BISULFATE 75 MG PO TABS: 75.0000 mg | ORAL_TABLET | Freq: Every day | ORAL | Status: DC

## 2020-04-15 NOTE — Telephone Encounter (Signed)
Lucino wife is calling stating Samuel George is currently hospitalized and is needing to reschedule his New Patient appointment. Please advise.

## 2020-04-15 NOTE — Progress Notes (Signed)
VASCULAR LAB PRELIMINARY  PRELIMINARY  PRELIMINARY  PRELIMINARY  Carotid duplex completed.    Preliminary report:  See CV proc for preliminary results.  Paged Dr. Jomarie Longs, [facility], RVT 04/15/2020, 10:35 AM

## 2020-04-15 NOTE — Progress Notes (Signed)
  Echocardiogram 2D Echocardiogram has been performed.  Samuel George 04/15/2020, 9:42 AM

## 2020-04-15 NOTE — Telephone Encounter (Signed)
Appointment made for 5/25 with Dr. Fletcher Anon.

## 2020-04-15 NOTE — ED Notes (Signed)
Pt transported to MRI 

## 2020-04-15 NOTE — Plan of Care (Signed)
  Problem: Education: Goal: Knowledge of disease or condition will improve Outcome: Progressing Goal: Knowledge of secondary prevention will improve Outcome: Progressing   Problem: Coping: Goal: Will verbalize positive feelings about self Outcome: Progressing Goal: Will identify appropriate support needs Outcome: Progressing   Problem: Nutrition: Goal: Risk of aspiration will decrease Outcome: Progressing   Problem: Clinical Measurements: Goal: Will remain free from infection Outcome: Progressing

## 2020-04-15 NOTE — ED Notes (Signed)
Pt currently in US.

## 2020-04-15 NOTE — Consult Note (Addendum)
Hospital Consult    Reason for Consult:  Carotid stenosis Requesting Physician:   MRN #:  [id number]  History of Present Illness: This is a 69 y.o. male with medical history significant for hypertension. His wife is present with him in the ED.  He states that he presented to ER yesterday evening via EMS. He describes that around 5:15 pm yesterday he was trying to use a straw cleaner to clean a straw and he was unable to coordinate his hands, he then tried to tell his wife about what he was feeling and she says his speech was gibberish and she noticed also that the right side of his face was drooping. This episode lasted approximately 45 seconds-1 minute. She immediately called EMS. By the time of arrival in ED he was feeling sort of back to his baseline.   He and his wife explain that starting in December 2020 he had an episode of lightheadedness which was unusual for him. He went to his PCP and was found to be very hypertensive. 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 They also have noticed since December that his has increased fatigue, increased lack of attention, brain "fogginess", and transient right upper extremity numbness. Otherwise deny any previous difficulty with speaking, visual changes, lower extremity numbness or weakness.  Stat CT head and neck was obtained on arrival to ER showing calficied soft plaque at left carotid bifurcation with soft plaque in the proximal left ICA with nearly occlusive stenosis (radiographic string sign) with more distally small cervical ICA. MRI brain showing subacute infarcts in the left frontal and parietal lobe   Past Medical History:  Diagnosis Date  . Hypertension     No past surgical history on file.  No Known Allergies  Prior to Admission medications   Medication Sig Start Date End Date Taking? Authorizing Provider  amLODipine (NORVASC) 10 MG tablet Take 1 tablet (10  mg total) by mouth daily. 01/10/20  Yes Pixie Casino, MD  aspirin EC 81 MG tablet Take 81 mg by mouth daily.   Yes [provider]  irbesartan-hydrochlorothiazide (AVALIDE) 300-12.5 MG tablet Take 1 tablet by mouth daily. 03/08/20  Yes Hilty, Nadean Corwin, MD  Multiple Vitamin (MULTIVITAMIN WITH MINERALS) TABS tablet Take 1 tablet by mouth daily.   Yes [provider]  Nebivolol HCl (BYSTOLIC) 20 MG TABS Take 1 tablet (20 mg total) by mouth daily. 03/19/20  Yes Skeet Latch, MD  spironolactone (ALDACTONE) 25 MG tablet Take 0.5 tablets (12.5 mg total) by mouth daily. 04/04/20 07/03/20 Yes Skeet Latch, MD    Social History   Socioeconomic History  . Marital status: Married    Spouse name: Not on file  . Number of children: Not on file  . Years of education: Not on file  . Highest education level: Not on file  Occupational History  . Not on file  Tobacco Use  . Smoking status: Never Smoker  . Smokeless tobacco: Never Used  Substance and Sexual Activity  . Alcohol use: Not on file  . Drug use: Not on file  . Sexual activity: Not on file  Other Topics Concern  . Not on file  Social History Narrative  . Not on file   Social Determinants of Health   Financial Resource Strain:   . Difficulty of Paying Living Expenses:   Food Insecurity: No Food Insecurity  . Worried About Charity fundraiser in the Last  Year: Never true  . Ran Out of Food in the Last Year: Never true  Transportation Needs: No Transportation Needs  . Lack of Transportation (Medical): No  . Lack of Transportation (Non-Medical): No  Physical Activity: Insufficiently Active  . Days of Exercise per Week: 3 days  . Minutes of Exercise per Session: 30 min  Stress: No Stress Concern Present  . Feeling of Stress : Not at all  Social Connections:   . Frequency of Communication with Friends and Family:   . Frequency of Social Gatherings with Friends and Family:   . Attends Religious Services:   .  Active Member of Clubs or Organizations:   . Attends Archivist Meetings:   [name] Marital Status:   Intimate Partner Violence:   . Fear of Current or Ex-Partner:   . Emotionally Abused:   [name] Physically Abused:   . Sexually Abused:      Family History  Problem Relation Age of Onset  . Hypertension Mother     ROS: Otherwise negative unless mentioned in HPI  Physical Examination  Vitals:   04/15/20 0600 04/15/20 0700  BP: (!) 163/80 (!) 153/81  Pulse: (!) 57 (!) 56  Resp: 19 14  Temp:    SpO2: 100% 99%   Body mass index is 30.27 kg/m.  General:  Well developed and well nourished, not in any distress Gait: Not observed HENT: WNL, normocephalic Pulmonary: normal non-labored breathing, without wheezing Cardiac: regular, without  Murmurs, rubs or gallops; without carotid bruits Abdomen: soft, NT/ND, no masses Skin: without rashes Vascular Exam/Pulses: 2+ radial and femoral pulses, 2+ DP bilaterally. Bilateral lower extremities well perfused and warm Extremities: without ischemic changes, without Gangrene , without cellulitis; without open wounds; 5/5 upper and lower extremity strength, normal tone Musculoskeletal: no muscle wasting or atrophy  Neurologic: A&O X 3;  No focal weakness or paresthesias are detected; speech is fluent/normal, face symmetric, tongue midline. Sensation equal bilaterally. Psychiatric:  The pt has Normal affect.   CBC    Component Value Date/Time   WBC 4.5 04/14/2020 1938   RBC 4.51 04/14/2020 1938   HGB 14.3 04/14/2020 2003   HCT 42.0 04/14/2020 2003   PLT 209 04/14/2020 1938   MCV 96.5 04/14/2020 1938   MCH 31.5 04/14/2020 1938   MCHC 32.6 04/14/2020 1938   RDW 12.2 04/14/2020 1938   LYMPHSABS 1.2 04/14/2020 1938   MONOABS 0.4 04/14/2020 1938   EOSABS 0.1 04/14/2020 1938   BASOSABS 0.0 04/14/2020 1938    BMET    Component Value Date/Time   NA 137 04/14/2020 2003   NA 137 03/28/2020 0916   K 4.0 04/14/2020 2003   CL 107  04/14/2020 2003   CO2 20 (L) 04/14/2020 1938   GLUCOSE 90 04/14/2020 2003   BUN 31 (H) 04/14/2020 2003   BUN 24 03/28/2020 0916   CREATININE 1.50 (H) 04/14/2020 2003   CALCIUM 9.0 04/14/2020 1938   GFRNONAA 46 (L) 04/14/2020 1938   GFRAA 54 (L) 04/14/2020 1938    COAGS: Lab Results  Component Value Date   INR 1.0 04/14/2020   INR SPECIMEN CLOTTED 04/14/2020     Non-Invasive Vascular Imaging: MRI brain 04/14/20 IMPRESSION: 1. Punctate foci of acute ischemia within the inferior left frontal lobe and left parietal lobe and left precentral gyrus. No hemorrhage or mass effect. 2. 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  CT Angio neck/ head w or wo contrast  IMPRESSION: 1. Critical proximal left ICA stenosis (radiographic  string sign) with age indeterminate infarcts in the deep left cerebral white matter (watershed ischemia). 2. Mild proximal left supraclinoid ICA stenosis. 3. Hypoplastic vertebrobasilar circulation. 4. Aortic Atherosclerosis (ICD10-I70.0).   Statin:  Yes.   Beta Blocker:  No. Aspirin:  Yes.   ACEI:  No. ARB:  Yes.   CCB use:  Yes Other antiplatelets/anticoagulants:  Yes.   Plavix   ASSESSMENT/PLAN: This is a 69 y.o. male who presented with right facial droop, slurred speech and loss of hand coordination. 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 CTA showing severe left ICA stenosis with string sign. Continue Aspirin, Statin and Plavix. Patient will likely need Carotid Endarterectomy but Vascular surgeon on call Dr. Carlis Abbott will see patient later today with further treatment planning and timing of CEA vs TCAR  DVT Prophylaxis: Lovenox  Corrina Baglia PA-C [facility] [phone] 04/15/2020  8:28 AM   I have seen and evaluated the patient. I agree with the PA note as documented above.  69 year old male presented to the ED last night with acute onset of difficulty speaking as well as right facial droop and clumsiness.   Ultimately further work-up in the ED after code stroke showed high-grade left ICA stenosis with soft appearing plaque and string sign.  MRI also suggested small left brain event in frontal and parietal lobe.  He has since recovered and appears he is back to his neurologic baseline.  Will allow him to complete stroke work-up but discussed with him and his wife that this is likely atheroembolic event from his carotid disease.  Did order carotid duplex to evaluate artifact vs lesion in mid left CCA.  Would likely benefit from carotid intervention later this week.  Vascular surgery will continue to follow.  Marty Heck, MD [facility] [place] Office: [phone]

## 2020-04-15 NOTE — ED Notes (Signed)
Breakfast ordered 

## 2020-04-15 NOTE — Progress Notes (Signed)
PROGRESS NOTE    Samuel George    Code Status: Full Code  MA:[id number] DOB: Apr 02, 1951 DOA: 04/14/2020 LOS: 1 days  PCP: London Pepper, MD CC: No chief complaint on file.      Hospital Summary   This is a 69 year old male with past medical history of hypertension who presented to the ED with acute onset slurred speech and right facial droop that occurred between 545 and 6 PM on 5/16 associated with lack of coordination with his hands and garbled speech which resolved within 1 minute.  Wife called 911, EMS BP 220/140.  Patient was transported to the hospital and was noted to have SBP 150s to 160s upon arrival.  CTA head and neck revealed critical proximal left ICA stenosis with string sign and age-indeterminate watershed infarcts in the deep left cerebral white matter. MRI showed punctate foci of acute ischemia in inferior left frontal lobe and left parietal lobe and precentral gyrus with multiple old left corona radiata small vessel infarcts. Neurology was consulted and he was started on aspirin 325 mg daily, high-intensity statin with permissive hypertension.  Vascular surgery was consulted on 5/17.      A & P   Principal Problem:   Cerebral infarction, watershed distribution, unilateral, acute ([place]) Active Problems:   Essential hypertension   Left carotid stenosis   Acute ischemic stroke (HCC)   CKD (chronic kidney disease) stage 3, GFR 30-59 ml/min   1. Acute punctate ischemia of left frontal lobe, parietal lobe and precentral gyrus a. Symptoms have since resolved b. CTA head neck with evidence of critical stenosis of LICA c. MRI brain with Acute punctate ischemia of left frontal lobe, parietal lobe and precentral gyrus and multiple old corona radiata small vessel infarcts d. Started on full dose aspirin and received loading dose of Plavix e. LDL 109.  Started on Lipitor 40 mg daily with goal LDL <70 f. Echo pending g. Bilateral carotid ultrasound with LICA 80 to [id number] stenosis  and R ICA 1 to 39% stenosis h. Continue stroke work-up and risk factor modification and further neurology recommendations i. Check HA1C j. Telemetry k. Vascular surgery consulted.  Patient is likely to need vascular intervention later this week  2. Hypertension a. Holding home antihypertensives b. Allowing permissive hypertension   3. Asymptomatic pyuria a. Holding antibiotics for now  4. CKD 3 a, stable a. Follow-up in a.m.   DVT prophylaxis: Lovenox Family Communication: Patient's wife at bedside has been updated  Disposition Plan:  Status is: Inpatient  Remains inpatient appropriate because:Ongoing diagnostic testing needed not appropriate for outpatient work up   Dispo: The patient is from: Home              Anticipated d/c is to: Home              Anticipated d/c date is: 3 days              Patient currently is not medically stable to d/c.           Pressure injury documentation    None  Consultants  Neurology Vascular surgery   Procedures  None  Antibiotics   Anti-infectives (From admission, onward)   None        Subjective   Patient seen and examined at bedside in no acute distress and resting comfortably. No acute events overnight. Denies any acute complaints at this time. Ambulating. Tolerating diet well.   Objective   Vitals:   04/15/20 0700 04/15/20 0800 04/15/20  0900 04/15/20 1000  BP: (!) 153/81 (!) 142/72 140/74 139/84  Pulse: (!) 56 64 (!) 57 (!) 56  Resp: 14 16 18 13   Temp:      TempSrc:      SpO2: 99% 100% 98% 99%  Weight:      Height:       No intake or output data in the 24 hours ending 04/15/20 1229 Filed Weights   04/14/20 1828  Weight: 98.4 kg    Examination:  Physical Exam Vitals and nursing note reviewed.  Constitutional:      Appearance: Normal appearance.  HENT:     Head: Normocephalic and atraumatic.  Eyes:     Conjunctiva/sclera: Conjunctivae normal.  Cardiovascular:     Rate and Rhythm: Normal  rate and regular rhythm.     Comments: No carotid bruit noted Pulmonary:     Effort: Pulmonary effort is normal.     Breath sounds: Normal breath sounds.  Abdominal:     General: Abdomen is flat.     Palpations: Abdomen is soft.  Musculoskeletal:        General: No swelling or tenderness.  Skin:    Coloration: Skin is not jaundiced or pale.  Neurological:     General: No focal deficit present.     Mental Status: He is alert. Mental status is at baseline.     Cranial Nerves: No cranial nerve deficit.     Motor: No weakness.     Coordination: Coordination normal.  Psychiatric:        Mood and Affect: Mood normal.        Behavior: Behavior normal.     Data Reviewed: I have personally reviewed following labs and imaging studies  CBC: Recent Labs  Lab 04/14/20 1938 04/14/20 2003  WBC 4.5  --   NEUTROABS 2.8  --   HGB 14.2 14.3  HCT 43.5 42.0  MCV 96.5  --   PLT 209  --    Basic Metabolic Panel: Recent Labs  Lab 04/14/20 1938 04/14/20 2003  NA 136 137  K 4.4 4.0  CL 105 107  CO2 20*  --   GLUCOSE 93 90  BUN 30* 31*  CREATININE 1.51* 1.50*  CALCIUM 9.0  --    GFR: Estimated Creatinine Clearance: 55.6 mL/min (A) (by C-G formula based on SCr of 1.5 mg/dL (H)). Liver Function Tests: Recent Labs  Lab 04/14/20 1938  AST 20  ALT 25  ALKPHOS 50  BILITOT 0.4  PROT 7.7  ALBUMIN 4.0   No results for input(s): LIPASE, AMYLASE in the last 168 hours. No results for input(s): AMMONIA in the last 168 hours. Coagulation Profile: Recent Labs  Lab 04/14/20 1938 04/14/20 2040  INR SPECIMEN CLOTTED 1.0   Cardiac Enzymes: No results for input(s): CKTOTAL, CKMB, CKMBINDEX, TROPONINI in the last 168 hours. BNP (last 3 results) No results for input(s): PROBNP in the last 8760 hours. HbA1C: No results for input(s): HGBA1C in the last 72 hours. CBG: No results for input(s): GLUCAP in the last 168 hours. Lipid Profile: Recent Labs    04/15/20 0311  CHOL 186  HDL  68  LDLCALC 109*  TRIG 47  CHOLHDL 2.7   Thyroid Function Tests: No results for input(s): TSH, T4TOTAL, FREET4, T3FREE, THYROIDAB in the last 72 hours. Anemia Panel: No results for input(s): VITAMINB12, FOLATE, FERRITIN, TIBC, IRON, RETICCTPCT in the last 72 hours. Sepsis Labs: No results for input(s): PROCALCITON, LATICACIDVEN in the last 168 hours.  Recent  Results (from the past 240 hour(s))  SARS Coronavirus 2 by RT PCR (hospital order, performed in [facility] hospital lab) Nasopharyngeal Nasopharyngeal Swab     Status: None   Collection Time: 04/14/20  9:58 PM   Specimen: Nasopharyngeal Swab  Result Value Ref Range Status   SARS Coronavirus 2 NEGATIVE NEGATIVE Final    Comment: (NOTE) SARS-CoV-2 target nucleic acids are NOT DETECTED. The SARS-CoV-2 RNA is generally detectable in upper and lower respiratory specimens during the acute phase of infection. The lowest concentration of SARS-CoV-2 viral copies this assay can detect is 250 copies / mL. A negative result does not preclude SARS-CoV-2 infection and should not be used as the sole basis for treatment or other patient management decisions.  A negative result may occur with improper specimen collection / handling, submission of specimen other than nasopharyngeal swab, presence of viral mutation(s) within the areas targeted by this assay, and inadequate number of viral copies (<250 copies / mL). A negative result must be combined with clinical observations, patient history, and epidemiological information. Fact Sheet for Patients:   StrictlyIdeas.no Fact Sheet for Healthcare Providers: BankingDealers.co.za This test is not yet approved or cleared  by the [place] FDA and has been authorized for detection and/or diagnosis of SARS-CoV-2 by FDA under an Emergency Use Authorization (EUA).  This EUA will remain in effect (meaning this test can be used) for the duration of  the COVID-19 declaration under Section 564(b)(1) of the Act, 21 U.S.C. section 360bbb-3(b)(1), unless the authorization is terminated or revoked sooner. Performed at [facility], [place] [address], [place], [place] [address]          Radiology Studies: CT Angio Head W or Wo Contrast  Result Date: 04/14/2020 CLINICAL DATA:  Transient right facial droop, aphasia, and ataxia. EXAM: CT ANGIOGRAPHY HEAD AND NECK TECHNIQUE: Multidetector CT imaging of the head and neck was performed using the standard protocol during bolus administration of intravenous contrast. Multiplanar CT image reconstructions and MIPs were obtained to evaluate the vascular anatomy. Carotid stenosis measurements (when applicable) are obtained utilizing NASCET criteria, using the distal internal carotid diameter as the denominator. CONTRAST:  79mL OMNIPAQUE IOHEXOL 350 MG/ML SOLN COMPARISON:  None. FINDINGS: CT HEAD FINDINGS Brain: No acute cortically based infarct, intracranial hemorrhage, mass, midline shift, or extra-axial fluid collection is identified. There is asymmetric hypoattenuation in the left frontoparietal white matter at the level of the centrum semiovale and corona radiata with several subcentimeter discrete hypodense foci compatible with small infarcts of indeterminate age. Vascular: Calcified atherosclerosis at the skull base. No hyperdense vessel. Skull: No fracture or suspicious osseous lesion. Sinuses: Moderate, partially polypoid mucosal thickening in the paranasal sinuses. Clear mastoid air cells. Orbits: Unremarkable. Review of the MIP images confirms the above findings CTA NECK FINDINGS Aortic arch: Standard 3 vessel aortic arch with widely patent arch vessel origins. Right carotid system: Patent with mild, predominantly calcified plaque at the carotid bifurcation. No evidence of significant stenosis or dissection. Left carotid system: Widely patent common carotid artery. Calcified and soft plaque at the  carotid bifurcation. Soft plaque in the proximal ICA resulting in a nearly occlusive stenosis (radiographic string sign). Patent but diffusely small cervical ICA more distally consistent with reduced flow. Vertebral arteries: Patent and small bilateral. No significant stenosis or dissection identified although venous contrast limits assessment of the left V1 segment. 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 Possible central disc protrusion and moderate to severe spinal stenosis at C6-7. Other neck: No  evidence of cervical lymphadenopathy or mass. Upper chest: Clear lung apices. Review of the MIP images confirms the above findings CTA HEAD FINDINGS Anterior circulation: The internal carotid arteries are patent from skull base to carotid termini without significant stenosis on the right. Calcified plaque results in mild proximal right supraclinoid stenosis. The left A1 segment is hypoplastic. ACAs and MCAs are patent without evidence of a significant A1 or M1 stenosis. The branch vessels are suboptimally evaluated due to image noise resulting in a diffusely irregular and attenuated appearance which is mildly greater involving the left MCA branches, however no convincing proximal branch occlusion is identified. No aneurysm is identified. Posterior circulation: The intracranial vertebral arteries are patent to the basilar. Patent left PICA, right AICA, and bilateral SCA origins are identified. The basilar artery is patent and congenitally small in caliber diffusely. There are fetal origins of the PCAs. Both PCAs are patent without evidence of a flow limiting proximal stenosis. No aneurysm is identified. Venous sinuses: Not well evaluated due to contrast timing. Anatomic variants: Hypoplastic vertebrobasilar circulation and left A1 segment. Review of the MIP images confirms the above findings IMPRESSION: 1. Critical proximal left ICA stenosis (radiographic string sign) with age indeterminate infarcts in the  deep left cerebral white matter (watershed ischemia). 2. Mild proximal left supraclinoid ICA stenosis. 3. Hypoplastic vertebrobasilar circulation. 4. Aortic Atherosclerosis (ICD10-I70.0). Electronically Signed   By: Logan Bores M.D.   On: 04/14/2020 21:03   DG Chest 2 View  Result Date: 04/14/2020 CLINICAL DATA:  Stroke. EXAM: CHEST - 2 VIEW COMPARISON:  None. FINDINGS: The cardiomediastinal contours are normal. The lungs are clear. Pulmonary vasculature is normal. No consolidation, pleural effusion, or pneumothorax. No acute osseous abnormalities are seen. Mild degenerative change in the spine. IMPRESSION: No acute chest findings. Electronically Signed   By: Keith Rake M.D.   On: 04/14/2020 22:42   CT Angio Neck W and/or Wo Contrast  Result Date: 04/14/2020 CLINICAL DATA:  Transient right facial droop, aphasia, and ataxia. EXAM: CT ANGIOGRAPHY HEAD AND NECK TECHNIQUE: Multidetector CT imaging of the head and neck was performed using the standard protocol during bolus administration of intravenous contrast. Multiplanar CT image reconstructions and MIPs were obtained to evaluate the vascular anatomy. Carotid stenosis measurements (when applicable) are obtained utilizing NASCET criteria, using the distal internal carotid diameter as the denominator. CONTRAST:  2mL OMNIPAQUE IOHEXOL 350 MG/ML SOLN COMPARISON:  None. FINDINGS: CT HEAD FINDINGS Brain: No acute cortically based infarct, intracranial hemorrhage, mass, midline shift, or extra-axial fluid collection is identified. There is asymmetric hypoattenuation in the left frontoparietal white matter at the level of the centrum semiovale and corona radiata with several subcentimeter discrete hypodense foci compatible with small infarcts of indeterminate age. Vascular: Calcified atherosclerosis at the skull base. No hyperdense vessel. Skull: No fracture or suspicious osseous lesion. Sinuses: Moderate, partially polypoid mucosal thickening in the paranasal  sinuses. Clear mastoid air cells. Orbits: Unremarkable. Review of the MIP images confirms the above findings CTA NECK FINDINGS Aortic arch: Standard 3 vessel aortic arch with widely patent arch vessel origins. Right carotid system: Patent with mild, predominantly calcified plaque at the carotid bifurcation. No evidence of significant stenosis or dissection. Left carotid system: Widely patent common carotid artery. Calcified and soft plaque at the carotid bifurcation. Soft plaque in the proximal ICA resulting in a nearly occlusive stenosis (radiographic string sign). Patent but diffusely small cervical ICA more distally consistent with reduced flow. Vertebral arteries: Patent and small bilateral. No significant stenosis or dissection  identified although venous contrast limits assessment of the left V1 segment. 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 Possible central disc protrusion and moderate to severe spinal stenosis at C6-7. Other neck: No evidence of cervical lymphadenopathy or mass. Upper chest: Clear lung apices. Review of the MIP images confirms the above findings CTA HEAD FINDINGS Anterior circulation: The internal carotid arteries are patent from skull base to carotid termini without significant stenosis on the right. Calcified plaque results in mild proximal right supraclinoid stenosis. The left A1 segment is hypoplastic. ACAs and MCAs are patent without evidence of a significant A1 or M1 stenosis. The branch vessels are suboptimally evaluated due to image noise resulting in a diffusely irregular and attenuated appearance which is mildly greater involving the left MCA branches, however no convincing proximal branch occlusion is identified. No aneurysm is identified. Posterior circulation: The intracranial vertebral arteries are patent to the basilar. Patent left PICA, right AICA, and bilateral SCA origins are identified. The basilar artery is patent and congenitally small in caliber  diffusely. There are fetal origins of the PCAs. Both PCAs are patent without evidence of a flow limiting proximal stenosis. No aneurysm is identified. Venous sinuses: Not well evaluated due to contrast timing. Anatomic variants: Hypoplastic vertebrobasilar circulation and left A1 segment. Review of the MIP images confirms the above findings IMPRESSION: 1. Critical proximal left ICA stenosis (radiographic string sign) with age indeterminate infarcts in the deep left cerebral white matter (watershed ischemia). 2. Mild proximal left supraclinoid ICA stenosis. 3. Hypoplastic vertebrobasilar circulation. 4. Aortic Atherosclerosis (ICD10-I70.0). Electronically Signed   By: Logan Bores M.D.   On: 04/14/2020 21:03   MR BRAIN WO CONTRAST  Result Date: 04/15/2020 CLINICAL DATA:  Slurred speech and right facial droop EXAM: MRI HEAD WITHOUT CONTRAST TECHNIQUE: Multiplanar, multiecho pulse sequences of the brain and surrounding structures were obtained without intravenous contrast. COMPARISON:  None. FINDINGS: Brain: There are punctate foci of abnormal diffusion restriction within the inferior left frontal lobe and at the superior left parietal lobe and left precentral gyrus. No contralateral diffusion abnormality. No infratentorial diffusion abnormality. Multifocal white matter hyperintensity, most commonly due to chronic ischemic microangiopathy. 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 Normal volume of CSF spaces. No chronic microhemorrhage. Normal midline structures. Vascular: Normal flow voids. Skull and upper cervical spine: Normal marrow signal. Sinuses/Orbits: Negative. Other: None. IMPRESSION: 1. Punctate foci of acute ischemia within the inferior left frontal lobe and left parietal lobe and left precentral gyrus. No hemorrhage or mass effect. 2. 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 Electronically Signed   By: Ulyses Jarred M.D.   On: 04/15/2020 02:09   VAS US CAROTID  Result  Date: 04/15/2020 Carotid Arterial Duplex Study Indications:       CVA, Speech disturbance, Weakness and String sign noted in                    left ICA by CTA of neck. Risk Factors:      Hypertension. Comparison Study:  No prior study on file Performing Technologist: Sharion Dove RVS  Examination Guidelines: A complete evaluation includes B-mode imaging, spectral Doppler, color Doppler, and power Doppler as needed of all accessible portions of each vessel. Bilateral testing is considered an integral part of a complete examination. Limited examinations for reoccurring indications may be performed as noted.  Right Carotid Findings: +----------+--------+--------+--------+------------------+------------------+           PSV cm/sEDV cm/sStenosisPlaque DescriptionComments           +----------+--------+--------+--------+------------------+------------------+  CCA Prox  78      17                                intimal thickening +----------+--------+--------+--------+------------------+------------------+ CCA Distal76      16                                intimal thickening +----------+--------+--------+--------+------------------+------------------+ ICA Prox  85      26              heterogenous                         +----------+--------+--------+--------+------------------+------------------+ ICA Distal101     24                                                   +----------+--------+--------+--------+------------------+------------------+ ECA       107     13                                                   +----------+--------+--------+--------+------------------+------------------+ +----------+--------+-------+--------+-------------------+           PSV cm/sEDV cmsDescribeArm Pressure (mmHG) +----------+--------+-------+--------+-------------------+ [id number]                                         +----------+--------+-------+--------+-------------------+  +---------+--------+--+--------+--+ VertebralPSV cm/s67EDV cm/s16 +---------+--------+--+--------+--+  Left Carotid Findings: +----------+--------+--------+--------+------------------+--------+           PSV cm/sEDV cm/sStenosisPlaque DescriptionComments +----------+--------+--------+--------+------------------+--------+ CCA Prox  83      16              heterogenous               +----------+--------+--------+--------+------------------+--------+ CCA Distal56      12              heterogenous               +----------+--------+--------+--------+------------------+--------+ ICA Prox  179     60              heterogenous               +----------+--------+--------+--------+------------------+--------+ ICA Mid   393     140     80-99%                             +----------+--------+--------+--------+------------------+--------+ ICA Distal516     160                                        +----------+--------+--------+--------+------------------+--------+ ECA       179     32                                         +----------+--------+--------+--------+------------------+--------+ +----------+--------+--------+--------+-------------------+  PSV cm/sEDV cm/sDescribeArm Pressure (mmHG) +----------+--------+--------+--------+-------------------+ [id number]                                         +----------+--------+--------+--------+-------------------+ +---------+--------+--+--------+--+ VertebralPSV cm/s56EDV cm/s12 +---------+--------+--+--------+--+   Summary: Right Carotid: Velocities in the right ICA are consistent with a 1-39% stenosis. Left Carotid: Velocities in the left ICA are consistent with a 80-99% stenosis. Vertebrals:  Bilateral vertebral arteries demonstrate antegrade flow. Subclavians: Normal flow hemodynamics were seen in bilateral subclavian              arteries. *See table(s) above for measurements and  observations.     Preliminary         Scheduled Meds: .  stroke: mapping our early stages of recovery book   Does not apply Once  . aspirin EC  325 mg Oral Daily  . atorvastatin  40 mg Oral Daily  . [START ON 04/16/2020] clopidogrel  75 mg Oral Daily  . enoxaparin (LOVENOX) injection  40 mg Subcutaneous Q24H  . multivitamin with minerals  1 tablet Oral Daily   Continuous Infusions:   Time spent: 35 minutes with over 50% of the time coordinating the patient's care    Harold Hedge, DO [facility] Pager [phone]  Call night coverage person covering after 7pm

## 2020-04-15 NOTE — ED Notes (Signed)
Pt restful in bed at this time without acute needs.  Reviewed POC and what to expect during their wait.  Recliner brought in for wife.

## 2020-04-15 NOTE — Progress Notes (Signed)
STROKE TEAM PROGRESS NOTE   INTERVAL HISTORY No family at the bedside.  Patient lying in bed, no complaints.  He stated that he had hand incoordination, slurred speech with right facial droop lasting only 45 seconds and resolved.  MRI showed 3 punctate left MCA infarcts.  CTA head and neck showed left ICA string sign which confirmed with carotid Doppler.  Vascular surgeon Dr. Carlis Abbott is on board, plan to do CTA later this week.  Vitals:   04/15/20 0700 04/15/20 0800 04/15/20 0900 04/15/20 1000  BP: (!) 153/81 (!) 142/72 140/74 139/84  Pulse: (!) 56 64 (!) 57 (!) 56  Resp: 14 16 18 13   Temp:      TempSrc:      SpO2: 99% 100% 98% 99%  Weight:      Height:        CBC:  Recent Labs  Lab 04/14/20 1938 04/14/20 2003  WBC 4.5  --   NEUTROABS 2.8  --   HGB 14.2 14.3  HCT 43.5 42.0  MCV 96.5  --   PLT 209  --     Basic Metabolic Panel:  Recent Labs  Lab 04/14/20 1938 04/14/20 2003  NA 136 137  K 4.4 4.0  CL 105 107  CO2 20*  --   GLUCOSE 93 90  BUN 30* 31*  CREATININE 1.51* 1.50*  CALCIUM 9.0  --    Lipid Panel:     Component Value Date/Time   CHOL 186 04/15/2020 0311   TRIG 47 04/15/2020 0311   HDL 68 04/15/2020 0311   CHOLHDL 2.7 04/15/2020 0311   VLDL 9 04/15/2020 0311   LDLCALC 109 (H) 04/15/2020 0311   HgbA1c: No results found for: HGBA1C Urine Drug Screen:     Component Value Date/Time   LABOPIA NONE DETECTED 04/14/2020 1938   COCAINSCRNUR NONE DETECTED 04/14/2020 1938   LABBENZ NONE DETECTED 04/14/2020 1938   AMPHETMU NONE DETECTED 04/14/2020 1938   THCU NONE DETECTED 04/14/2020 1938   LABBARB NONE DETECTED 04/14/2020 1938    Alcohol Level     Component Value Date/Time   ETH 13 (H) 04/14/2020 1938    IMAGING past 24 hours CT Angio Head W or Wo Contrast  Result Date: 04/14/2020 CLINICAL DATA:  Transient right facial droop, aphasia, and ataxia. EXAM: CT ANGIOGRAPHY HEAD AND NECK TECHNIQUE: Multidetector CT imaging of the head and neck was performed  using the standard protocol during bolus administration of intravenous contrast. Multiplanar CT image reconstructions and MIPs were obtained to evaluate the vascular anatomy. Carotid stenosis measurements (when applicable) are obtained utilizing NASCET criteria, using the distal internal carotid diameter as the denominator. CONTRAST:  35mL OMNIPAQUE IOHEXOL 350 MG/ML SOLN COMPARISON:  None. FINDINGS: CT HEAD FINDINGS Brain: No acute cortically based infarct, intracranial hemorrhage, mass, midline shift, or extra-axial fluid collection is identified. There is asymmetric hypoattenuation in the left frontoparietal white matter at the level of the centrum semiovale and corona radiata with several subcentimeter discrete hypodense foci compatible with small infarcts of indeterminate age. Vascular: Calcified atherosclerosis at the skull base. No hyperdense vessel. Skull: No fracture or suspicious osseous lesion. Sinuses: Moderate, partially polypoid mucosal thickening in the paranasal sinuses. Clear mastoid air cells. Orbits: Unremarkable. Review of the MIP images confirms the above findings CTA NECK FINDINGS Aortic arch: Standard 3 vessel aortic arch with widely patent arch vessel origins. Right carotid system: Patent with mild, predominantly calcified plaque at the carotid bifurcation. No evidence of significant stenosis or dissection. Left carotid system:  Widely patent common carotid artery. Calcified and soft plaque at the carotid bifurcation. Soft plaque in the proximal ICA resulting in a nearly occlusive stenosis (radiographic string sign). Patent but diffusely small cervical ICA more distally consistent with reduced flow. Vertebral arteries: Patent and small bilateral. No significant stenosis or dissection identified although venous contrast limits assessment of the left V1 segment. 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 Possible central disc protrusion and moderate to severe spinal stenosis at C6-7.  Other neck: No evidence of cervical lymphadenopathy or mass. Upper chest: Clear lung apices. Review of the MIP images confirms the above findings CTA HEAD FINDINGS Anterior circulation: The internal carotid arteries are patent from skull base to carotid termini without significant stenosis on the right. Calcified plaque results in mild proximal right supraclinoid stenosis. The left A1 segment is hypoplastic. ACAs and MCAs are patent without evidence of a significant A1 or M1 stenosis. The branch vessels are suboptimally evaluated due to image noise resulting in a diffusely irregular and attenuated appearance which is mildly greater involving the left MCA branches, however no convincing proximal branch occlusion is identified. No aneurysm is identified. Posterior circulation: The intracranial vertebral arteries are patent to the basilar. Patent left PICA, right AICA, and bilateral SCA origins are identified. The basilar artery is patent and congenitally small in caliber diffusely. There are fetal origins of the PCAs. Both PCAs are patent without evidence of a flow limiting proximal stenosis. No aneurysm is identified. Venous sinuses: Not well evaluated due to contrast timing. Anatomic variants: Hypoplastic vertebrobasilar circulation and left A1 segment. Review of the MIP images confirms the above findings IMPRESSION: 1. Critical proximal left ICA stenosis (radiographic string sign) with age indeterminate infarcts in the deep left cerebral white matter (watershed ischemia). 2. Mild proximal left supraclinoid ICA stenosis. 3. Hypoplastic vertebrobasilar circulation. 4. Aortic Atherosclerosis (ICD10-I70.0). Electronically Signed   By: Logan Bores M.D.   On: 04/14/2020 21:03   DG Chest 2 View  Result Date: 04/14/2020 CLINICAL DATA:  Stroke. EXAM: CHEST - 2 VIEW COMPARISON:  None. FINDINGS: The cardiomediastinal contours are normal. The lungs are clear. Pulmonary vasculature is normal. No consolidation, pleural  effusion, or pneumothorax. No acute osseous abnormalities are seen. Mild degenerative change in the spine. IMPRESSION: No acute chest findings. Electronically Signed   By: Keith Rake M.D.   On: 04/14/2020 22:42   CT Angio Neck W and/or Wo Contrast  Result Date: 04/14/2020 CLINICAL DATA:  Transient right facial droop, aphasia, and ataxia. EXAM: CT ANGIOGRAPHY HEAD AND NECK TECHNIQUE: Multidetector CT imaging of the head and neck was performed using the standard protocol during bolus administration of intravenous contrast. Multiplanar CT image reconstructions and MIPs were obtained to evaluate the vascular anatomy. Carotid stenosis measurements (when applicable) are obtained utilizing NASCET criteria, using the distal internal carotid diameter as the denominator. CONTRAST:  82mL OMNIPAQUE IOHEXOL 350 MG/ML SOLN COMPARISON:  None. FINDINGS: CT HEAD FINDINGS Brain: No acute cortically based infarct, intracranial hemorrhage, mass, midline shift, or extra-axial fluid collection is identified. There is asymmetric hypoattenuation in the left frontoparietal white matter at the level of the centrum semiovale and corona radiata with several subcentimeter discrete hypodense foci compatible with small infarcts of indeterminate age. Vascular: Calcified atherosclerosis at the skull base. No hyperdense vessel. Skull: No fracture or suspicious osseous lesion. Sinuses: Moderate, partially polypoid mucosal thickening in the paranasal sinuses. Clear mastoid air cells. Orbits: Unremarkable. Review of the MIP images confirms the above findings CTA NECK FINDINGS Aortic arch: Standard  3 vessel aortic arch with widely patent arch vessel origins. Right carotid system: Patent with mild, predominantly calcified plaque at the carotid bifurcation. No evidence of significant stenosis or dissection. Left carotid system: Widely patent common carotid artery. Calcified and soft plaque at the carotid bifurcation. Soft plaque in the proximal  ICA resulting in a nearly occlusive stenosis (radiographic string sign). Patent but diffusely small cervical ICA more distally consistent with reduced flow. Vertebral arteries: Patent and small bilateral. No significant stenosis or dissection identified although venous contrast limits assessment of the left V1 segment. 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 Possible central disc protrusion and moderate to severe spinal stenosis at C6-7. Other neck: No evidence of cervical lymphadenopathy or mass. Upper chest: Clear lung apices. Review of the MIP images confirms the above findings CTA HEAD FINDINGS Anterior circulation: The internal carotid arteries are patent from skull base to carotid termini without significant stenosis on the right. Calcified plaque results in mild proximal right supraclinoid stenosis. The left A1 segment is hypoplastic. ACAs and MCAs are patent without evidence of a significant A1 or M1 stenosis. The branch vessels are suboptimally evaluated due to image noise resulting in a diffusely irregular and attenuated appearance which is mildly greater involving the left MCA branches, however no convincing proximal branch occlusion is identified. No aneurysm is identified. Posterior circulation: The intracranial vertebral arteries are patent to the basilar. Patent left PICA, right AICA, and bilateral SCA origins are identified. The basilar artery is patent and congenitally small in caliber diffusely. There are fetal origins of the PCAs. Both PCAs are patent without evidence of a flow limiting proximal stenosis. No aneurysm is identified. Venous sinuses: Not well evaluated due to contrast timing. Anatomic variants: Hypoplastic vertebrobasilar circulation and left A1 segment. Review of the MIP images confirms the above findings IMPRESSION: 1. Critical proximal left ICA stenosis (radiographic string sign) with age indeterminate infarcts in the deep left cerebral white matter (watershed  ischemia). 2. Mild proximal left supraclinoid ICA stenosis. 3. Hypoplastic vertebrobasilar circulation. 4. Aortic Atherosclerosis (ICD10-I70.0). Electronically Signed   By: Logan Bores M.D.   On: 04/14/2020 21:03   MR BRAIN WO CONTRAST  Result Date: 04/15/2020 CLINICAL DATA:  Slurred speech and right facial droop EXAM: MRI HEAD WITHOUT CONTRAST TECHNIQUE: Multiplanar, multiecho pulse sequences of the brain and surrounding structures were obtained without intravenous contrast. COMPARISON:  None. FINDINGS: Brain: There are punctate foci of abnormal diffusion restriction within the inferior left frontal lobe and at the superior left parietal lobe and left precentral gyrus. No contralateral diffusion abnormality. No infratentorial diffusion abnormality. Multifocal white matter hyperintensity, most commonly due to chronic ischemic microangiopathy. 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 Normal volume of CSF spaces. No chronic microhemorrhage. Normal midline structures. Vascular: Normal flow voids. Skull and upper cervical spine: Normal marrow signal. Sinuses/Orbits: Negative. Other: None. IMPRESSION: 1. Punctate foci of acute ischemia within the inferior left frontal lobe and left parietal lobe and left precentral gyrus. No hemorrhage or mass effect. 2. 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 Electronically Signed   By: Ulyses Jarred M.D.   On: 04/15/2020 02:09   VAS US CAROTID  Result Date: 04/15/2020 Carotid Arterial Duplex Study Indications:       CVA, Speech disturbance, Weakness and String sign noted in                    left ICA by CTA of neck. Risk Factors:      Hypertension.  Comparison Study:  No prior study on file Performing Technologist: Sharion Dove RVS  Examination Guidelines: A complete evaluation includes B-mode imaging, spectral Doppler, color Doppler, and power Doppler as needed of all accessible portions of each vessel. Bilateral testing is considered an  integral part of a complete examination. Limited examinations for reoccurring indications may be performed as noted.  Right Carotid Findings: +----------+--------+--------+--------+------------------+------------------+           PSV cm/sEDV cm/sStenosisPlaque DescriptionComments           +----------+--------+--------+--------+------------------+------------------+ CCA Prox  78      17                                intimal thickening +----------+--------+--------+--------+------------------+------------------+ CCA Distal76      16                                intimal thickening +----------+--------+--------+--------+------------------+------------------+ ICA Prox  85      26              heterogenous                         +----------+--------+--------+--------+------------------+------------------+ ICA Distal101     24                                                   +----------+--------+--------+--------+------------------+------------------+ ECA       107     13                                                   +----------+--------+--------+--------+------------------+------------------+ +----------+--------+-------+--------+-------------------+           PSV cm/sEDV cmsDescribeArm Pressure (mmHG) +----------+--------+-------+--------+-------------------+ [id number]                                         +----------+--------+-------+--------+-------------------+ +---------+--------+--+--------+--+ VertebralPSV cm/s67EDV cm/s16 +---------+--------+--+--------+--+  Left Carotid Findings: +----------+--------+--------+--------+------------------+--------+           PSV cm/sEDV cm/sStenosisPlaque DescriptionComments +----------+--------+--------+--------+------------------+--------+ CCA Prox  83      16              heterogenous               +----------+--------+--------+--------+------------------+--------+ CCA Distal56      12               heterogenous               +----------+--------+--------+--------+------------------+--------+ ICA Prox  179     60              heterogenous               +----------+--------+--------+--------+------------------+--------+ ICA Mid   393     140     80-99%                             +----------+--------+--------+--------+------------------+--------+ ICA Distal516     160                                        +----------+--------+--------+--------+------------------+--------+  ECA       179     32                                         +----------+--------+--------+--------+------------------+--------+ +----------+--------+--------+--------+-------------------+           PSV cm/sEDV cm/sDescribeArm Pressure (mmHG) +----------+--------+--------+--------+-------------------+ [id number]                                         +----------+--------+--------+--------+-------------------+ +---------+--------+--+--------+--+ VertebralPSV cm/s56EDV cm/s12 +---------+--------+--+--------+--+   Summary: Right Carotid: Velocities in the right ICA are consistent with a 1-39% stenosis. Left Carotid: Velocities in the left ICA are consistent with a 80-99% stenosis. Vertebrals:  Bilateral vertebral arteries demonstrate antegrade flow. Subclavians: Normal flow hemodynamics were seen in bilateral subclavian              arteries. *See table(s) above for measurements and observations.     Preliminary     PHYSICAL EXAM  Temp:  [97.5 F (36.4 C)] 97.5 F (36.4 C) (05/16 1829) Pulse Rate:  [50-74] 56 (05/17 1700) Resp:  [13-21] 14 (05/17 1700) BP: (134-175)/(66-96) 136/72 (05/17 1700) SpO2:  [92 %-100 %] 99 % (05/17 1700) Weight:  [98.4 kg] 98.4 kg (05/16 1828)  General - Well nourished, well developed, in no apparent distress.  Ophthalmologic - fundi not visualized due to noncooperation.  Cardiovascular - Regular rhythm and rate.  Mental Status -  Level  of arousal and orientation to time, place, and person were intact. Language including expression, naming, repetition, comprehension was assessed and found intact. Attention span and concentration were normal. Fund of Knowledge was assessed and was intact.  Cranial Nerves II - XII - II - Visual field intact OU. III, IV, VI - Extraocular movements intact. V - Facial sensation intact bilaterally. VII - Facial movement intact bilaterally. VIII - Hearing & vestibular intact bilaterally. X - Palate elevates symmetrically. XI - Chin turning & shoulder shrug intact bilaterally. XII - Tongue protrusion intact.  Motor Strength - The patient's strength was normal in all extremities and pronator drift was absent.  Bulk was normal and fasciculations were absent.   Motor Tone - Muscle tone was assessed at the neck and appendages and was normal.  Reflexes - The patient's reflexes were symmetrical in all extremities and he had no pathological reflexes.  Sensory - Light touch, temperature/pinprick were assessed and were symmetrical.    Coordination - The patient had normal movements in the hands and feet with no ataxia or dysmetria.  Tremor was absent.  Gait and Station - deferred.   ASSESSMENT/PLAN [name] is a 69 y.o. male with history of HTN presenting with transient speech difficulty, facial droop and incoordination in setting of recent cognitive decline.   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 Mild proximal L supraclinoid ICA stenosis. Hypoplastic VB circulation. Aortic atherosclerosis.   MRI  Punctate inferior L frontal lobe, L parietal, L precentral gyrus infarcts. Multiple old L corona radiata lacunes.  Carotid Doppler  L ICA 80-99% stenosis   2D Echo pending  LDL 109  HgbA1c pending   UDS negative  Lovenox 40 mg sq  daily for VTE prophylaxis  aspirin 81 mg daily prior to admission, now on aspirin 325 mg daily and clopidogrel 75 mg daily following plavix load.  Continue DAPT for 3 weeks and then Plavix alone.  Therapy recommendations:  pending   Disposition:  pending   Carotid Stenosis   CTA neck L ICA string sign  Carotid Doppler  L ICA 80-99% stenosis   Vascular surgery Dr. Carlis Abbott on board  Plan for left CEA later this week  Essential Hypertension  Stable . Permissive hypertension (OK if < 220/120) for 24 to 48 hours and then gradually normalize in 3 to 5 days. . SBP goal 130-150 before revascularization given L ICA stenosis, after left CEA, long-term BP goal normotensive.  Hyperlipidemia  Home meds:  No statin  No on lipitor 80  LDL 109, goal < 70  Continue statin at discharge  Other Stroke Risk Factors  Advanced age  Obesity, Body mass index is 30.27 kg/m., recommend weight loss, diet and exercise as appropriate   Other Active Problems  UA w/ pyuria  CKD stage III, creatinine 1.51  Hospital day # 1  Neurology will sign off. Please call with questions. Pt will follow up with stroke clinic NP at [facility] in about 4 weeks. Thanks for the consult.  Rosalin Hawking, MD PhD Stroke Neurology 04/15/2020 5:57 PM    To contact Stroke Continuity provider, please refer to [email or web address] After hours, contact General Neurology

## 2020-04-15 NOTE — Progress Notes (Signed)
Patient arrived to the unit with wife. Patient ambulated to the bed in the room without difficulties. Patient orientated to the room and the use of the call light system. Patient verbalized understanding. Supper meal was ordered. Patient relaxing comfortably in bed at this time.

## 2020-04-15 NOTE — ED Notes (Signed)
Attempt to call report x 2.  States waiting for Insurance underwriter.

## 2020-04-16 ENCOUNTER — Ambulatory Visit: Payer: Medicare Other | Admitting: Cardiovascular Disease

## 2020-04-16 LAB — BASIC METABOLIC PANEL WITH GFR
Anion gap: 11 (ref 5–15)
BUN: 28 mg/dL — ABNORMAL HIGH (ref 8–23)
CO2: 21 mmol/L — ABNORMAL LOW (ref 22–32)
Calcium: 8.8 mg/dL — ABNORMAL LOW (ref 8.9–10.3)
Chloride: 107 mmol/L (ref 98–111)
Creatinine, Ser: 1.55 mg/dL — ABNORMAL HIGH (ref 0.61–1.24)
GFR calc Af Amer: 52 mL/min — ABNORMAL LOW (ref >60)
GFR calc non Af Amer: 45 mL/min — ABNORMAL LOW (ref >60)
Glucose, Bld: 102 mg/dL — ABNORMAL HIGH (ref 70–99)
Potassium: 3.7 mmol/L (ref 3.5–5.1)
Sodium: 139 mmol/L (ref 135–145)

## 2020-04-16 LAB — CBC
HCT: 39.8 % (ref 39.0–52.0)
Hemoglobin: 13.4 g/dL (ref 13.0–17.0)
MCH: 31.8 pg (ref 26.0–34.0)
MCHC: 33.7 g/dL (ref 30.0–36.0)
MCV: 94.5 fL (ref 80.0–100.0)
Platelets: 233 10*3/uL (ref 150–400)
RBC: 4.21 MIL/uL — ABNORMAL LOW (ref 4.22–5.81)
RDW: 12.4 % (ref 11.5–15.5)
WBC: 5.6 10*3/uL (ref 4.0–10.5)
nRBC: 0 % (ref 0.0–0.2)

## 2020-04-16 LAB — URINE CULTURE: Culture: >=100000 — AB

## 2020-04-16 NOTE — Progress Notes (Signed)
[facility]  Subjective  - No new neurologic events overnight.  Appears at his baseline.   Objective 130/77 62 97.8 F (36.6 C) (Oral) 15 99% No intake or output data in the 24 hours ending 04/16/20 0721    Laboratory Lab Results: Recent Labs    04/14/20 1938 04/14/20 1938 04/14/20 2003 04/16/20 0246  WBC 4.5  --   --  5.6  HGB 14.2   < > 14.3 13.4  HCT 43.5   < > 42.0 39.8  PLT 209  --   --  233   < > = values in this interval not displayed.   BMET Recent Labs    04/14/20 1938 04/14/20 1938 04/14/20 2003 04/16/20 0246  NA 136   < > 137 139  K 4.4   < > 4.0 3.7  CL 105   < > 107 107  CO2 20*  --   --  21*  GLUCOSE 93   < > 90 102*  BUN 30*   < > 31* 28*  CREATININE 1.51*   < > 1.50* 1.55*  CALCIUM 9.0  --   --  8.8*   < > = values in this interval not displayed.    COAG Lab Results  Component Value Date   INR 1.0 04/14/2020   INR SPECIMEN CLOTTED 04/14/2020   No results found for: PTT  Assessment/Planning:  69 year old male with high-grade left ICA stenosis and symptomatic lesion given MRI findings.  After review of CTA as well as duplex, I have recommended likely left TCAR later this week.  Concern is that difficult to evaluate distal extent of the lesion on CTA given underfilling and duplex suggested disease extending well into the mid ICA and even elevated velocities more distal.  We will continue dual antiplatelet therapy and start him on a statin.  Will look at OR schedule for Thursday or Friday later this week.  Risks and benefits discussed in detail with the patient this morning.  Marty Heck 04/16/2020 7:21 AM --

## 2020-04-16 NOTE — Evaluation (Signed)
Physical Therapy Evaluation and Discharge Patient Details Name: Samuel George MRN: [id number] DOB: [birth year] Today's Date: 04/16/2020   History of Present Illness  69 y.o. male with medical history significant of HTN diagnosed in Dec.  Pt presented 04/14/20 to ED with episode of slurred speech and R facial droop.  Also associated lack of coordination with hands. Symptoms spontaneously resolved within 1 min. CT head proximal L ICA stenosis, watershed infarcts deep L cerebral white matter; vascular consult with plan to do TCAR later this week.   Clinical Impression   Patient evaluated by Physical Therapy with no current PT needs identified. He scored 24/24 on Dynamic Gait Index and 55/56 on Berg Balance assessment. PT is signing off. Thank you for this referral. If patient requires PT assessment after surgery, please re-order at that time.      Follow Up Recommendations Other (comment)(may need another PT assessment after TCAR)    Equipment Recommendations  None recommended by PT    Recommendations for Other Services       Precautions / Restrictions Precautions Precautions: None Restrictions Weight Bearing Restrictions: No      Mobility  Bed Mobility               General bed mobility comments: OOB upon entry  Transfers Overall transfer level: Independent                  Ambulation/Gait Ambulation/Gait assistance: Independent Gait Distance (Feet): 250 Feet Assistive device: None Gait Pattern/deviations: WFL(Within Functional Limits)     General Gait Details: see DGI  Stairs            Wheelchair Mobility    Modified Rankin (Stroke Patients Only) Modified Rankin (Stroke Patients Only) Pre-Morbid Rankin Score: No symptoms Modified Rankin: No symptoms     Balance Overall balance assessment: Independent                               Standardized Balance Assessment Standardized Balance Assessment : Berg Balance Test;Dynamic Gait  Index Berg Balance Test Sit to Stand: Able to stand without using hands and stabilize independently Standing Unsupported: Able to stand safely 2 minutes Sitting with Back Unsupported but Feet Supported on Floor or Stool: Able to sit safely and securely 2 minutes Stand to Sit: Sits safely with minimal use of hands Transfers: Able to transfer safely, minor use of hands Standing Unsupported with Eyes Closed: Able to stand 10 seconds safely Standing Ubsupported with Feet Together: Able to place feet together independently and stand 1 minute safely From Standing, Reach Forward with Outstretched Arm: Can reach confidently >25 cm (10") From Standing Position, Pick up Object from Floor: Able to pick up shoe safely and easily From Standing Position, Turn to Look Behind Over each Shoulder: Looks behind from both sides and weight shifts well Turn 360 Degrees: Able to turn 360 degrees safely in 4 seconds or less Standing Unsupported, Alternately Place Feet on Step/Stool: Able to stand independently and safely and complete 8 steps in 20 seconds Standing Unsupported, One Foot in Front: Able to plae foot ahead of the other independently and hold 30 seconds Standing on One Leg: Able to lift leg independently and hold > 10 seconds Total Score: 55 Dynamic Gait Index Level Surface: Normal Change in Gait Speed: Normal Gait with Horizontal Head Turns: Normal Gait with Vertical Head Turns: Normal Gait and Pivot Turn: Normal Step Over Obstacle: Normal Step Around Obstacles: Normal Steps:  Normal Total Score: 24       Pertinent Vitals/Pain Pain Assessment: No/denies pain    Home Living Family/patient expects to be discharged to:: Private residence Living Arrangements: Spouse/significant other Available Help at Discharge: Family Type of Home: House Home Access: Stairs to enter Entrance Stairs-Rails: Right Entrance Stairs-Number of Steps: 4 Home Layout: Multi-level(3 levels) Home Equipment: None       Prior Function Level of Independence: Independent         Comments: working      Journalist, newspaper   Dominant Hand: Right    Extremity/Trunk Assessment   Upper Extremity Assessment Upper Extremity Assessment: Defer to OT evaluation    Lower Extremity Assessment Lower Extremity Assessment: Overall WFL for tasks assessed    Cervical / Trunk Assessment Cervical / Trunk Assessment: Normal  Communication   Communication: No difficulties  Cognition Arousal/Alertness: Awake/alert Behavior During Therapy: WFL for tasks assessed/performed Overall Cognitive Status: Within Functional Limits for tasks assessed                                        General Comments      Exercises     Assessment/Plan    PT Assessment Patent does not need any further PT services  PT Problem List         PT Treatment Interventions      PT Goals (Current goals can be found in the Care Plan section)  Acute Rehab PT Goals Patient Stated Goal: to get home after surgery PT Goal Formulation: All assessment and education complete, DC therapy    Frequency     Barriers to discharge        Co-evaluation               AM-PAC PT "6 Clicks" Mobility  Outcome Measure Help needed turning from your back to your side while in a flat bed without using bedrails?: None Help needed moving from lying on your back to sitting on the side of a flat bed without using bedrails?: None Help needed moving to and from a bed to a chair (including a wheelchair)?: None Help needed standing up from a chair using your arms (e.g., wheelchair or bedside chair)?: None Help needed to walk in hospital room?: None Help needed climbing 3-5 steps with a railing? : None 6 Click Score: 24    End of Session Equipment Utilized During Treatment: Gait belt Activity Tolerance: Patient tolerated treatment well Patient left: in chair;with call bell/phone within reach;with chair alarm set;Other (comment)(OT  entered) Nurse Communication: Mobility status;Other (comment)(per PT ok to be up I'ly) PT Visit Diagnosis: Other symptoms and signs involving the nervous system [id number])    Time: [id number] PT Time Calculation (min) (ACUTE ONLY): 15 min   Charges:   PT Evaluation $PT Eval Low Complexity: 1 Low           Arby Barrette, PT Pager [phone]   Rexanne Mano 04/16/2020, 12:14 PM

## 2020-04-16 NOTE — Plan of Care (Signed)
  Problem: Education: Goal: Knowledge of disease or condition will improve Outcome: Progressing   Problem: Coping: Goal: Will verbalize positive feelings about self Outcome: Progressing   Problem: Self-Care: Goal: Ability to participate in self-care as condition permits will improve Outcome: Progressing Goal: Ability to communicate needs accurately will improve Outcome: Progressing   Problem: Coping: Goal: Level of anxiety will decrease Outcome: Progressing

## 2020-04-16 NOTE — Evaluation (Signed)
Occupational Therapy Evaluation and Discharge Patient Details Name: Samuel George MRN: [id number] DOB: July 04, 1951 Today's Date: 04/16/2020    History of Present Illness 69 y.o. male with medical history significant of HTN diagnosed in Dec.  Pt presented 04/14/20 to ED with episode of slurred speech and R facial droop.  Also associated lack of coordination with hands. Symptoms spontaneously resolved within 1 min. CT head proximal L ICA stenosis, watershed infarcts deep L cerebral white matter; vascular consult with plan to do TCAR later this week.    Clinical Impression   PTA patient independent and working. Admitted for above and presenting at baseline independent level for ADLs, functional mobility and transfers. He demonstrates no functional deficits with strength, coordination, cognition, vision, or sensation. Discussed signs and symptoms of strokes. Noted planned carotid surgery Thursday or Friday.  Based on performance today, no further OT needs identified and pt with no further questions/concerns.  OT will sign off.  Please re-consult as needed after surgery.     Follow Up Recommendations  No OT follow up    Equipment Recommendations  None recommended by OT    Recommendations for Other Services       Precautions / Restrictions Precautions Precautions: None Restrictions Weight Bearing Restrictions: No      Mobility Bed Mobility               General bed mobility comments: OOB upon entry  Transfers Overall transfer level: Independent                    Balance Overall balance assessment: Independent                                         ADL either performed or assessed with clinical judgement   ADL Overall ADL's : Independent                                       General ADL Comments: patient demonstrates ability to complete ADLs with independence     Vision   Vision Assessment?: No apparent visual deficits      Perception     Praxis      Pertinent Vitals/Pain Pain Assessment: No/denies pain     Hand Dominance Right   Extremity/Trunk Assessment Upper Extremity Assessment Upper Extremity Assessment: Overall WFL for tasks assessed   Lower Extremity Assessment Lower Extremity Assessment: Overall WFL for tasks assessed   Cervical / Trunk Assessment Cervical / Trunk Assessment: Normal   Communication Communication Communication: No difficulties   Cognition Arousal/Alertness: Awake/alert Behavior During Therapy: WFL for tasks assessed/performed Overall Cognitive Status: Within Functional Limits for tasks assessed                                     General Comments       Exercises     Shoulder Instructions      Home Living Family/patient expects to be discharged to:: Private residence Living Arrangements: Spouse/significant other Available Help at Discharge: Family Type of Home: House Home Access: Stairs to enter Technical brewer of Steps: 4 Entrance Stairs-Rails: Right Home Layout: Multi-level(3 levels) Alternate Level Stairs-Number of Steps: 3 flights   Bathroom Shower/Tub: Occupational psychologist: Standard  Home Equipment: None          Prior Functioning/Environment Level of Independence: Independent        Comments: working         OT Problem List:        OT Treatment/Interventions:      OT Goals(Current goals can be found in the care plan section) Acute Rehab OT Goals Patient Stated Goal: to get home after surgery OT Goal Formulation: With patient  OT Frequency:     Barriers to D/C:            Co-evaluation              AM-PAC OT "6 Clicks" Daily Activity     Outcome Measure Help from another person eating meals?: None Help from another person taking care of personal grooming?: None Help from another person toileting, which includes using toliet, bedpan, or urinal?: None Help from another person  bathing (including washing, rinsing, drying)?: None Help from another person to put on and taking off regular upper body clothing?: None Help from another person to put on and taking off regular lower body clothing?: None 6 Click Score: 24   End of Session Nurse Communication: Mobility status  Activity Tolerance: Patient tolerated treatment well Patient left: in chair;with call bell/phone within reach  OT Visit Diagnosis: Other symptoms and signs involving the nervous system (R29.898)                Time: [id number] OT Time Calculation (min): 12 min Charges:  OT General Charges $OT Visit: 1 Visit OT Evaluation $OT Eval Low Complexity: 1 Low  Jolaine Artist, OT Acute Rehabilitation Services Pager [phone] Office [phone]    Delight Stare 04/16/2020, 11:01 AM

## 2020-04-16 NOTE — Evaluation (Signed)
Speech Language Pathology Evaluation Patient Details Name: Samuel George MRN: [id number] DOB: [birth year] Today's Date: 04/16/2020 Time: [id number] SLP Time Calculation (min) (ACUTE ONLY): 19 min  Problem List:  Patient Active Problem List   Diagnosis Date Noted  . Left carotid stenosis 04/14/2020  . Cerebral infarction, watershed distribution, unilateral, acute ([place]) 04/14/2020  . Acute ischemic stroke ([place]) 04/14/2020  . CKD (chronic kidney disease) stage 3, GFR 30-59 ml/min 04/14/2020  . Essential hypertension 02/07/2020  . PVC's (premature ventricular contractions) 02/07/2020   Past Medical History:  Past Medical History:  Diagnosis Date  . Hypertension    Past Surgical History: No past surgical history on file. HPI:  in Dec.  Pt presented 04/14/20 to ED with episode of slurred speech and R facial droop.  Also associated lack of coordination with hands. Symptoms spontaneously resolved within 1 min. CT head proximal L ICA stenosis, watershed infarcts deep L cerebral white matter; vascular consult with plan to do TCAR later this week.   Assessment / Plan / Recommendation Clinical Impression  Samuel George demonstrates speech and cognitive functions at baseline and WNL. Wife present to confirm his speech deficits only lasted about one minute and he has had no difficulty since that time. Pt jokingly began to recite the [employer]. He did this with excellent articulation/precision. Oral mech exam was unremarkable. He participated in complex articulation drills (tongue twisters), which he had no difficulty. He had single abnormal value, which was his maximum phonation time (6 seconds with normal being 15+ for adult men). He denies any shortness of breath with speech or swallowing. Respirations appeared shallow and clavicular, impacting phonation time. Diadokokinetic rates were WNL. He had no hyper/hyponasality noted. Voice appeared to be WNL for age and gender. He had 100%  intelligibility in conversation. Basic cognition was assessed and pt was WNL, oriented x4. Excellent historian of his medical situation and medical plan. Based on these findings, no further ST is indicated. Pt/wife in agreement with plan. Should status change after upcoming surgery, please re-consult for further evaluation/treatment.    SLP Assessment  SLP Recommendation/Assessment: Patient does not need any further Speech Lanaguage Pathology Services SLP Visit Diagnosis: Dysarthria and anarthria (R47.1)    Follow Up Recommendations  None    Frequency and Duration   No further ST needs.        SLP Evaluation Cognition  Overall Cognitive Status: Within Functional Limits for tasks assessed Arousal/Alertness: Awake/alert Orientation Level: Oriented X4 Attention: Focused Memory: Appears intact Problem Solving: Appears intact Executive Function: Reasoning Reasoning: Appears intact Safety/Judgment: Appears intact       Expression Written Expression Dominant Hand: Right   Oral / Motor  Oral Motor/Sensory Function Overall Oral Motor/Sensory Function: Within functional limits Motor Speech Overall Motor Speech: Appears within functional limits for tasks assessed Respiration: Within functional limits Phonation: Normal Resonance: Within functional limits Articulation: Within functional limitis Intelligibility: Intelligible Motor Planning: Witnin functional limits Motor Speech Errors: Not applicable   Samuel George P. Lavelle Berland, M.S., [place] Pathologist Acute Rehabilitation Services Pager: [place] 04/16/2020, 11:45 AM

## 2020-04-16 NOTE — Progress Notes (Signed)
  Progress Note    04/16/2020 7:24 AM * No surgery found *  Subjective:  No complaints this morning   Vitals:   04/15/20 2308 04/16/20 0309  BP: 132/68 130/77  Pulse: 67 62  Resp: 20 15  Temp: 97.8 F (36.6 C) 97.8 F (36.6 C)  SpO2: 100% 99%   Physical Exam: General:  Well developed and well nourished, not in any distress Gait: Not observed HENT: WNL, normocephalic Pulmonary: normal non-labored breathing Cardiac: regular Abdomen: soft, NT/ND, no masses Vascular Exam/Pulses: 2+ radial and femoral pulses, 2+ DP bilaterally. Bilateral lower extremities well perfused and warm Extremities: without ischemic changes, without Gangrene , without cellulitis; without open wounds; 5/5 upper and lower extremity strength, normal tone Musculoskeletal: no muscle wasting or atrophy       Neurologic: A&O X 3;  No focal weakness or paresthesias are detected; speech is fluent/normal, face symmetric, tongue midline. Sensation equal bilaterally.  CBC    Component Value Date/Time   WBC 5.6 04/16/2020 0246   RBC 4.21 (L) 04/16/2020 0246   HGB 13.4 04/16/2020 0246   HCT 39.8 04/16/2020 0246   PLT 233 04/16/2020 0246   MCV 94.5 04/16/2020 0246   MCH 31.8 04/16/2020 0246   MCHC 33.7 04/16/2020 0246   RDW 12.4 04/16/2020 0246   LYMPHSABS 1.2 04/14/2020 1938   MONOABS 0.4 04/14/2020 1938   EOSABS 0.1 04/14/2020 1938   BASOSABS 0.0 04/14/2020 1938    BMET    Component Value Date/Time   NA 139 04/16/2020 0246   NA 137 03/28/2020 0916   K 3.7 04/16/2020 0246   CL 107 04/16/2020 0246   CO2 21 (L) 04/16/2020 0246   GLUCOSE 102 (H) 04/16/2020 0246   BUN 28 (H) 04/16/2020 0246   BUN 24 03/28/2020 0916   CREATININE 1.55 (H) 04/16/2020 0246   CALCIUM 8.8 (L) 04/16/2020 0246   GFRNONAA 45 (L) 04/16/2020 0246   GFRAA 52 (L) 04/16/2020 0246    INR    Component Value Date/Time   INR 1.0 04/14/2020 2040    No intake or output data in the 24 hours ending 04/16/20 0724   Summary VAS  Carotid Duplex: Right Carotid: Velocities in the right ICA are consistent with a 1-39% stenosis. Left Carotid: Velocities in the left ICA are consistent with a 80-99% stenosis.    Assessment/Plan:  69 y.o. male with high grade left ICA stenosis and small left brain infarcts. Carotid ultrasound also showing high grade left ICA stenosis 80-99%. No intracardiac source of embolism detected on TTE. No new neurological deficits. Carotid intervention planned for later this week  Karoline Caldwell, PA-C [facility] [phone] 04/16/2020 7:24 AM

## 2020-04-16 NOTE — Progress Notes (Signed)
PROGRESS NOTE    Samuel George    Code Status: Full Code  [id number] DOB: Apr 07, 1951 DOA: 04/14/2020 LOS: 2 days  PCP: London Pepper, MD CC: No chief complaint on file.      Hospital Summary   This is a 69 year old male with past medical history of hypertension who presented to the ED with acute onset slurred speech and right facial droop that occurred between 545 and 6 PM on 5/16 associated with lack of coordination with his hands and garbled speech which resolved within 1 minute.  Wife called 911, EMS BP 220/140.  Patient was transported to the hospital and was noted to have SBP 150s to 160s upon arrival.  CTA head and neck revealed critical proximal left ICA stenosis with string sign and age-indeterminate watershed infarcts in the deep left cerebral white matter. MRI showed punctate foci of acute ischemia in inferior left frontal lobe and left parietal lobe and precentral gyrus with multiple old left corona radiata small vessel infarcts. Neurology was consulted and he was started on aspirin 325 mg daily, high-intensity statin with permissive hypertension.  Vascular surgery was consulted on 5/17.    A & P   Principal Problem:   Cerebral infarction, watershed distribution, unilateral, acute ([place]) Active Problems:   Essential hypertension   Left carotid stenosis   Acute ischemic stroke (HCC)   CKD (chronic kidney disease) stage 3, GFR 30-59 ml/min    1. Acute punctate ischemia of left frontal lobe, parietal lobe and precentral gyrus with critical stenosis of left ICA a. Symptoms have since resolved b. CTA head neck with evidence of critical stenosis of LICA c. MRI brain with Acute punctate ischemia of left frontal lobe, parietal lobe and precentral gyrus and multiple old corona radiata small vessel infarcts d. Dual antiplatelets with Plavix and full dose aspirin e. LDL 109.  Started on Lipitor 40 mg daily with goal LDL <70 f. Echo: grade 1 diastolic dysfunction, EF 60 to 65%,  mildly dilated left atrium g. Bilateral carotid ultrasound with LICA 80 to [id number] stenosis and R ICA 1 to 39% stenosis h. HA1C 5.8 i. Telemetry j. Vascular surgery on board: Likely left TCAR later this week  2. Hypertension a. Stable while holding home antihypertensives for permissive hypertension   3. Asymptomatic E. coli UTI a. Holding antibiotics for now  4. CKD 3 a, stable  5. Newly diagnosed prediabetes a. Encourage lifestyle modification  6. HFpEF, not in acute exacerbation a. Echo with grade 1 diastolic dysfunction, EF 66 5%   DVT prophylaxis: Lovenox Family Communication: Patient updated at bedside Disposition Plan:  Status is: Inpatient  Remains inpatient appropriate because:Ongoing diagnostic testing needed not appropriate for outpatient work up and Awaiting vascular procedure later this week   Dispo: The patient is from: Home              Anticipated d/c is to: Home              Anticipated d/c date is: 3 days              Patient currently is not medically stable to d/c.           Pressure injury documentation    None  Consultants  Neurology Vascular surgery   Procedures  None  Antibiotics   Anti-infectives (From admission, onward)   None        Subjective   Patient seen and examined at bedside no acute distress and resting comfortably.  No events overnight.  Tolerating diet.  Denies any chest pain, shortness of breath, fever, nausea, vomiting, hematuria, dysuria, urinary frequency.  No residual weakness.  Otherwise ROS negative   Objective   Vitals:   04/15/20 1928 04/15/20 2308 04/16/20 0309 04/16/20 0847  BP: (!) 143/74 132/68 130/77 136/77  Pulse: (!) 59 67 62 (!) 59  Resp: 20 20 15 18   Temp: 98.3 F (36.8 C) 97.8 F (36.6 C) 97.8 F (36.6 C) 98.5 F (36.9 C)  TempSrc: Oral Oral Oral Oral  SpO2: 99% 100% 99% 99%  Weight:      Height:        Intake/Output Summary (Last 24 hours) at 04/16/2020 1048 Last data filed at  04/16/2020 0848 Gross per 24 hour  Intake 236 ml  Output --  Net 236 ml   Filed Weights   04/14/20 1828  Weight: 98.4 kg    Examination:  Physical Exam Vitals and nursing note reviewed.  Constitutional:      Appearance: Normal appearance.  HENT:     Head: Normocephalic and atraumatic.  Eyes:     Conjunctiva/sclera: Conjunctivae normal.  Cardiovascular:     Rate and Rhythm: Normal rate and regular rhythm.  Pulmonary:     Effort: Pulmonary effort is normal.     Breath sounds: Normal breath sounds.  Abdominal:     General: Abdomen is flat.     Palpations: Abdomen is soft.  Musculoskeletal:        General: No swelling or tenderness.  Skin:    Coloration: Skin is not jaundiced or pale.  Neurological:     General: No focal deficit present.     Mental Status: He is alert. Mental status is at baseline.  Psychiatric:        Mood and Affect: Mood normal.        Behavior: Behavior normal.     Data Reviewed: I have personally reviewed following labs and imaging studies  CBC: Recent Labs  Lab 04/14/20 1938 04/14/20 2003 04/16/20 0246  WBC 4.5  --  5.6  NEUTROABS 2.8  --   --   HGB 14.2 14.3 13.4  HCT 43.5 42.0 39.8  MCV 96.5  --  94.5  PLT 209  --  0000000   Basic Metabolic Panel: Recent Labs  Lab 04/14/20 1938 04/14/20 2003 04/16/20 0246  NA 136 137 139  K 4.4 4.0 3.7  CL 105 107 107  CO2 20*  --  21*  GLUCOSE 93 90 102*  BUN 30* 31* 28*  CREATININE 1.51* 1.50* 1.55*  CALCIUM 9.0  --  8.8*   GFR: Estimated Creatinine Clearance: 53.8 mL/min (A) (by C-G formula based on SCr of 1.55 mg/dL (H)). Liver Function Tests: Recent Labs  Lab 04/14/20 1938  AST 20  ALT 25  ALKPHOS 50  BILITOT 0.4  PROT 7.7  ALBUMIN 4.0   No results for input(s): LIPASE, AMYLASE in the last 168 hours. No results for input(s): AMMONIA in the last 168 hours. Coagulation Profile: Recent Labs  Lab 04/14/20 1938 04/14/20 2040  INR SPECIMEN CLOTTED 1.0   Cardiac Enzymes: No  results for input(s): CKTOTAL, CKMB, CKMBINDEX, TROPONINI in the last 168 hours. BNP (last 3 results) No results for input(s): PROBNP in the last 8760 hours. HbA1C: Recent Labs    04/15/20 0311  HGBA1C 5.8*   CBG: No results for input(s): GLUCAP in the last 168 hours. Lipid Profile: Recent Labs    04/15/20 0311  CHOL 186  HDL 68  LDLCALC  109*  TRIG 47  CHOLHDL 2.7   Thyroid Function Tests: No results for input(s): TSH, T4TOTAL, FREET4, T3FREE, THYROIDAB in the last 72 hours. Anemia Panel: No results for input(s): VITAMINB12, FOLATE, FERRITIN, TIBC, IRON, RETICCTPCT in the last 72 hours. Sepsis Labs: No results for input(s): PROCALCITON, LATICACIDVEN in the last 168 hours.  Recent Results (from the past 240 hour(s))  SARS Coronavirus 2 by RT PCR (hospital order, performed in [facility] hospital lab) Nasopharyngeal Nasopharyngeal Swab     Status: None   Collection Time: 04/14/20  9:58 PM   Specimen: Nasopharyngeal Swab  Result Value Ref Range Status   SARS Coronavirus 2 NEGATIVE NEGATIVE Final    Comment: (NOTE) SARS-CoV-2 target nucleic acids are NOT DETECTED. The SARS-CoV-2 RNA is generally detectable in upper and lower respiratory specimens during the acute phase of infection. The lowest concentration of SARS-CoV-2 viral copies this assay can detect is 250 copies / mL. A negative result does not preclude SARS-CoV-2 infection and should not be used as the sole basis for treatment or other patient management decisions.  A negative result may occur with improper specimen collection / handling, submission of specimen other than nasopharyngeal swab, presence of viral mutation(s) within the areas targeted by this assay, and inadequate number of viral copies (<250 copies / mL). A negative result must be combined with clinical observations, patient history, and epidemiological information. Fact Sheet for Patients:   StrictlyIdeas.no Fact Sheet for  Healthcare Providers: BankingDealers.co.za This test is not yet approved or cleared  by the [place] FDA and has been authorized for detection and/or diagnosis of SARS-CoV-2 by FDA under an Emergency Use Authorization (EUA).  This EUA will remain in effect (meaning this test can be used) for the duration of the COVID-19 declaration under Section 564(b)(1) of the Act, 21 U.S.C. section 360bbb-3(b)(1), unless the authorization is terminated or revoked sooner. Performed at [facility], [place] [address] [place], [place] [address]   Culture, Urine     Status: Abnormal   Collection Time: 04/15/20  2:55 AM   Specimen: Urine, Clean Catch  Result Value Ref Range Status   Specimen Description URINE, CLEAN CATCH  Final   Special Requests   Final    NONE Performed at [facility], [place] [address] [place], [place] [address]    Culture >=100,000 COLONIES/mL ESCHERICHIA COLI (A)  Final   Report Status 04/16/2020 FINAL  Final   Organism ID, Bacteria ESCHERICHIA COLI (A)  Final      Susceptibility   Escherichia coli - MIC*    AMPICILLIN <=2 SENSITIVE Sensitive     CEFAZOLIN <=4 SENSITIVE Sensitive     CEFTRIAXONE <=1 SENSITIVE Sensitive     CIPROFLOXACIN <=0.25 SENSITIVE Sensitive     GENTAMICIN <=1 SENSITIVE Sensitive     IMIPENEM <=0.25 SENSITIVE Sensitive     NITROFURANTOIN <=16 SENSITIVE Sensitive     TRIMETH/SULFA <=20 SENSITIVE Sensitive     AMPICILLIN/SULBACTAM <=2 SENSITIVE Sensitive     PIP/TAZO <=4 SENSITIVE Sensitive     * >=100,000 COLONIES/mL ESCHERICHIA COLI         Radiology Studies: CT Angio Head W or Wo Contrast  Result Date: 04/14/2020 CLINICAL DATA:  Transient right facial droop, aphasia, and ataxia. EXAM: CT ANGIOGRAPHY HEAD AND NECK TECHNIQUE: Multidetector CT imaging of the head and neck was performed using the standard protocol during bolus administration of intravenous contrast. Multiplanar CT image reconstructions and MIPs  were obtained to evaluate the vascular anatomy.  Carotid stenosis measurements (when applicable) are obtained utilizing NASCET criteria, using the distal internal carotid diameter as the denominator. CONTRAST:  56mL OMNIPAQUE IOHEXOL 350 MG/ML SOLN COMPARISON:  None. FINDINGS: CT HEAD FINDINGS Brain: No acute cortically based infarct, intracranial hemorrhage, mass, midline shift, or extra-axial fluid collection is identified. There is asymmetric hypoattenuation in the left frontoparietal white matter at the level of the centrum semiovale and corona radiata with several subcentimeter discrete hypodense foci compatible with small infarcts of indeterminate age. Vascular: Calcified atherosclerosis at the skull base. No hyperdense vessel. Skull: No fracture or suspicious osseous lesion. Sinuses: Moderate, partially polypoid mucosal thickening in the paranasal sinuses. Clear mastoid air cells. Orbits: Unremarkable. Review of the MIP images confirms the above findings CTA NECK FINDINGS Aortic arch: Standard 3 vessel aortic arch with widely patent arch vessel origins. Right carotid system: Patent with mild, predominantly calcified plaque at the carotid bifurcation. No evidence of significant stenosis or dissection. Left carotid system: Widely patent common carotid artery. Calcified and soft plaque at the carotid bifurcation. Soft plaque in the proximal ICA resulting in a nearly occlusive stenosis (radiographic string sign). Patent but diffusely small cervical ICA more distally consistent with reduced flow. Vertebral arteries: Patent and small bilateral. No significant stenosis or dissection identified although venous contrast limits assessment of the left V1 segment. 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 Possible central disc protrusion and moderate to severe spinal stenosis at C6-7. Other neck: No evidence of cervical lymphadenopathy or mass. Upper chest: Clear lung apices. Review of the MIP images confirms  the above findings CTA HEAD FINDINGS Anterior circulation: The internal carotid arteries are patent from skull base to carotid termini without significant stenosis on the right. Calcified plaque results in mild proximal right supraclinoid stenosis. The left A1 segment is hypoplastic. ACAs and MCAs are patent without evidence of a significant A1 or M1 stenosis. The branch vessels are suboptimally evaluated due to image noise resulting in a diffusely irregular and attenuated appearance which is mildly greater involving the left MCA branches, however no convincing proximal branch occlusion is identified. No aneurysm is identified. Posterior circulation: The intracranial vertebral arteries are patent to the basilar. Patent left PICA, right AICA, and bilateral SCA origins are identified. The basilar artery is patent and congenitally small in caliber diffusely. There are fetal origins of the PCAs. Both PCAs are patent without evidence of a flow limiting proximal stenosis. No aneurysm is identified. Venous sinuses: Not well evaluated due to contrast timing. Anatomic variants: Hypoplastic vertebrobasilar circulation and left A1 segment. Review of the MIP images confirms the above findings IMPRESSION: 1. Critical proximal left ICA stenosis (radiographic string sign) with age indeterminate infarcts in the deep left cerebral white matter (watershed ischemia). 2. Mild proximal left supraclinoid ICA stenosis. 3. Hypoplastic vertebrobasilar circulation. 4. Aortic Atherosclerosis (ICD10-I70.0). Electronically Signed   By: Logan Bores M.D.   On: 04/14/2020 21:03   DG Chest 2 View  Result Date: 04/14/2020 CLINICAL DATA:  Stroke. EXAM: CHEST - 2 VIEW COMPARISON:  None. FINDINGS: The cardiomediastinal contours are normal. The lungs are clear. Pulmonary vasculature is normal. No consolidation, pleural effusion, or pneumothorax. No acute osseous abnormalities are seen. Mild degenerative change in the spine. IMPRESSION: No acute  chest findings. Electronically Signed   By: Keith Rake M.D.   On: 04/14/2020 22:42   CT Angio Neck W and/or Wo Contrast  Result Date: 04/14/2020 CLINICAL DATA:  Transient right facial droop, aphasia, and ataxia. EXAM: CT ANGIOGRAPHY HEAD AND NECK TECHNIQUE:  Multidetector CT imaging of the head and neck was performed using the standard protocol during bolus administration of intravenous contrast. Multiplanar CT image reconstructions and MIPs were obtained to evaluate the vascular anatomy. Carotid stenosis measurements (when applicable) are obtained utilizing NASCET criteria, using the distal internal carotid diameter as the denominator. CONTRAST:  13mL OMNIPAQUE IOHEXOL 350 MG/ML SOLN COMPARISON:  None. FINDINGS: CT HEAD FINDINGS Brain: No acute cortically based infarct, intracranial hemorrhage, mass, midline shift, or extra-axial fluid collection is identified. There is asymmetric hypoattenuation in the left frontoparietal white matter at the level of the centrum semiovale and corona radiata with several subcentimeter discrete hypodense foci compatible with small infarcts of indeterminate age. Vascular: Calcified atherosclerosis at the skull base. No hyperdense vessel. Skull: No fracture or suspicious osseous lesion. Sinuses: Moderate, partially polypoid mucosal thickening in the paranasal sinuses. Clear mastoid air cells. Orbits: Unremarkable. Review of the MIP images confirms the above findings CTA NECK FINDINGS Aortic arch: Standard 3 vessel aortic arch with widely patent arch vessel origins. Right carotid system: Patent with mild, predominantly calcified plaque at the carotid bifurcation. No evidence of significant stenosis or dissection. Left carotid system: Widely patent common carotid artery. Calcified and soft plaque at the carotid bifurcation. Soft plaque in the proximal ICA resulting in a nearly occlusive stenosis (radiographic string sign). Patent but diffusely small cervical ICA more distally  consistent with reduced flow. Vertebral arteries: Patent and small bilateral. No significant stenosis or dissection identified although venous contrast limits assessment of the left V1 segment. 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 Possible central disc protrusion and moderate to severe spinal stenosis at C6-7. Other neck: No evidence of cervical lymphadenopathy or mass. Upper chest: Clear lung apices. Review of the MIP images confirms the above findings CTA HEAD FINDINGS Anterior circulation: The internal carotid arteries are patent from skull base to carotid termini without significant stenosis on the right. Calcified plaque results in mild proximal right supraclinoid stenosis. The left A1 segment is hypoplastic. ACAs and MCAs are patent without evidence of a significant A1 or M1 stenosis. The branch vessels are suboptimally evaluated due to image noise resulting in a diffusely irregular and attenuated appearance which is mildly greater involving the left MCA branches, however no convincing proximal branch occlusion is identified. No aneurysm is identified. Posterior circulation: The intracranial vertebral arteries are patent to the basilar. Patent left PICA, right AICA, and bilateral SCA origins are identified. The basilar artery is patent and congenitally small in caliber diffusely. There are fetal origins of the PCAs. Both PCAs are patent without evidence of a flow limiting proximal stenosis. No aneurysm is identified. Venous sinuses: Not well evaluated due to contrast timing. Anatomic variants: Hypoplastic vertebrobasilar circulation and left A1 segment. Review of the MIP images confirms the above findings IMPRESSION: 1. Critical proximal left ICA stenosis (radiographic string sign) with age indeterminate infarcts in the deep left cerebral white matter (watershed ischemia). 2. Mild proximal left supraclinoid ICA stenosis. 3. Hypoplastic vertebrobasilar circulation. 4. Aortic Atherosclerosis  (ICD10-I70.0). Electronically Signed   By: Logan Bores M.D.   On: 04/14/2020 21:03   MR BRAIN WO CONTRAST  Result Date: 04/15/2020 CLINICAL DATA:  Slurred speech and right facial droop EXAM: MRI HEAD WITHOUT CONTRAST TECHNIQUE: Multiplanar, multiecho pulse sequences of the brain and surrounding structures were obtained without intravenous contrast. COMPARISON:  None. FINDINGS: Brain: There are punctate foci of abnormal diffusion restriction within the inferior left frontal lobe and at the superior left parietal lobe and left precentral gyrus.  No contralateral diffusion abnormality. No infratentorial diffusion abnormality. Multifocal white matter hyperintensity, most commonly due to chronic ischemic microangiopathy. 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 Normal volume of CSF spaces. No chronic microhemorrhage. Normal midline structures. Vascular: Normal flow voids. Skull and upper cervical spine: Normal marrow signal. Sinuses/Orbits: Negative. Other: None. IMPRESSION: 1. Punctate foci of acute ischemia within the inferior left frontal lobe and left parietal lobe and left precentral gyrus. No hemorrhage or mass effect. 2. 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 Electronically Signed   By: Ulyses Jarred M.D.   On: 04/15/2020 02:09   ECHOCARDIOGRAM COMPLETE  Result Date: 04/15/2020    ECHOCARDIOGRAM REPORT   Patient Name:   VIRGINIA HARLEMAN Date of Exam: 04/15/2020 Medical Rec #:  [id number]      Height:       71.0 in Accession #:    [id number]     Weight:       217.0 lb Date of Birth:  [birth year]      BSA:          2.183 m Patient Age:    72 years       BP:           153/81 mmHg Patient Gender: M              HR:           55 bpm. Exam Location:  Inpatient Procedure: 2D Echo, Cardiac Doppler, Color Doppler and Strain Analysis Indications:    Stroke  History:        Patient has prior history of Echocardiogram examinations, most                 recent 12/20/2019. Abnormal ECG, Stroke;  Risk                 Factors:Hypertension.  Sonographer:    Roseanna Rainbow RDCS Referring Phys: [place]  1. Left ventricular ejection fraction, by estimation, is 60 to 65%. The left ventricle has normal function. The left ventricle has no regional wall motion abnormalities. There is mild concentric left ventricular hypertrophy. Left ventricular diastolic parameters are consistent with Grade I diastolic dysfunction (impaired relaxation).  2. Right ventricular systolic function is normal. The right ventricular size is normal. There is normal pulmonary artery systolic pressure.  3. Left atrial size was mildly dilated.  4. The mitral valve is normal in structure. Trivial mitral valve regurgitation. No evidence of mitral stenosis.  5. The aortic valve is tricuspid. Aortic valve regurgitation is not visualized. No aortic stenosis is present.  6. The inferior vena cava is normal in size with greater than 50% respiratory variability, suggesting right atrial pressure of 3 mmHg. Comparison(s): No significant change from prior study. Conclusion(s)/Recommendation(s): No intracardiac source of embolism detected on this transthoracic study. A transesophageal echocardiogram is recommended to exclude cardiac source of embolism if clinically indicated. FINDINGS  Left Ventricle: Left ventricular ejection fraction, by estimation, is 60 to 65%. The left ventricle has normal function. The left ventricle has no regional wall motion abnormalities. The left ventricular internal cavity size was normal in size. There is  mild concentric left ventricular hypertrophy. Left ventricular diastolic parameters are consistent with Grade I diastolic dysfunction (impaired relaxation). Right Ventricle: The right ventricular size is normal. No increase in right ventricular wall thickness. Right ventricular systolic function is normal. There is normal pulmonary artery systolic pressure. The tricuspid regurgitant velocity is 2.42 m/s,  and  with  an assumed right atrial pressure of 3 mmHg, the estimated right ventricular systolic pressure is [id number] mmHg. Left Atrium: Left atrial size was mildly dilated. Right Atrium: Right atrial size was normal in size. Pericardium: Trivial pericardial effusion is present. Mitral Valve: The mitral valve is normal in structure. Trivial mitral valve regurgitation. No evidence of mitral valve stenosis. Tricuspid Valve: The tricuspid valve is normal in structure. Tricuspid valve regurgitation is trivial. No evidence of tricuspid stenosis. Aortic Valve: The aortic valve is tricuspid. Aortic valve regurgitation is not visualized. No aortic stenosis is present. Pulmonic Valve: The pulmonic valve was not well visualized. Pulmonic valve regurgitation is not visualized. No evidence of pulmonic stenosis. Aorta: The aortic root and ascending aorta are structurally normal, with no evidence of dilitation. Venous: The inferior vena cava is normal in size with greater than 50% respiratory variability, suggesting right atrial pressure of 3 mmHg. IAS/Shunts: No atrial level shunt detected by color flow Doppler.  LEFT VENTRICLE PLAX 2D LVIDd:         4.88 cm      Diastology LVIDs:         3.32 cm      LV e' lateral:   7.83 cm/s LV PW:         1.24 cm      LV E/e' lateral: 6.3 LV IVS:        1.38 cm      LV e' medial:    7.94 cm/s LVOT diam:     2.30 cm      LV E/e' medial:  6.2 LV SV:         82 LV SV Index:   37           2D Longitudinal Strain LVOT Area:     4.15 cm     2D Strain GLS (A2C):   -20.1 %                             2D Strain GLS (A3C):   -20.1 %                             2D Strain GLS (A4C):   -23.2 % LV Volumes (MOD)            2D Strain GLS Avg:     -21.2 % LV vol d, MOD A2C: 84.1 ml LV vol d, MOD A4C: 104.0 ml LV vol s, MOD A2C: 30.5 ml LV vol s, MOD A4C: 29.1 ml LV SV MOD A2C:     53.6 ml LV SV MOD A4C:     104.0 ml LV SV MOD BP:      62.3 ml RIGHT VENTRICLE             IVC RV S prime:     11.70 cm/s  IVC diam:  1.79 cm TAPSE (M-mode): 2.3 cm LEFT ATRIUM             Index       RIGHT ATRIUM           Index LA diam:        3.40 cm 1.56 cm/m  RA Area:     15.80 cm LA Vol (A2C):   69.0 ml 31.61 ml/m RA Volume:   37.70 ml  17.27 ml/m LA Vol (A4C):   70.9 ml 32.48 ml/m LA Biplane Vol: 70.2 ml 32.16 ml/m  AORTIC VALVE LVOT Vmax:   92.40 cm/s LVOT Vmean:  62.500 cm/s LVOT VTI:    0.197 m  AORTA Ao Root diam: 3.30 cm Ao Asc diam:  3.30 cm MITRAL VALVE               TRICUSPID VALVE MV Area (PHT): 1.75 cm    TR Peak grad:   23.4 mmHg MV Decel Time: 433 msec    TR Vmax:        242.00 cm/s MV E velocity: 49.30 cm/s MV A velocity: 99.80 cm/s  SHUNTS MV E/A ratio:  0.49        Systemic VTI:  0.20 m                            Systemic Diam: 2.30 cm Buford Dresser MD Electronically signed by Buford Dresser MD Signature Date/Time: 04/15/2020/6:04:25 PM    Final    VAS US CAROTID  Result Date: 04/15/2020 Carotid Arterial Duplex Study Indications:       CVA, Speech disturbance, Weakness and String sign noted in                    left ICA by CTA of neck. Risk Factors:      Hypertension. Comparison Study:  No prior study on file Performing Technologist: Sharion Dove RVS  Examination Guidelines: A complete evaluation includes B-mode imaging, spectral Doppler, color Doppler, and power Doppler as needed of all accessible portions of each vessel. Bilateral testing is considered an integral part of a complete examination. Limited examinations for reoccurring indications may be performed as noted.  Right Carotid Findings: +----------+--------+--------+--------+------------------+------------------+           PSV cm/sEDV cm/sStenosisPlaque DescriptionComments           +----------+--------+--------+--------+------------------+------------------+ CCA Prox  78      17                                intimal thickening +----------+--------+--------+--------+------------------+------------------+ CCA Distal76       16                                intimal thickening +----------+--------+--------+--------+------------------+------------------+ ICA Prox  85      26              heterogenous                         +----------+--------+--------+--------+------------------+------------------+ ICA Distal101     24                                                   +----------+--------+--------+--------+------------------+------------------+ ECA       107     13                                                   +----------+--------+--------+--------+------------------+------------------+ +----------+--------+-------+--------+-------------------+           PSV cm/sEDV cmsDescribeArm Pressure (mmHG) +----------+--------+-------+--------+-------------------+ [id number]                                         +----------+--------+-------+--------+-------------------+ +---------+--------+--+--------+--+  VertebralPSV cm/s67EDV cm/s16 +---------+--------+--+--------+--+  Left Carotid Findings: +----------+--------+--------+--------+------------------+--------+           PSV cm/sEDV cm/sStenosisPlaque DescriptionComments +----------+--------+--------+--------+------------------+--------+ CCA Prox  83      16              heterogenous               +----------+--------+--------+--------+------------------+--------+ CCA Distal56      12              heterogenous               +----------+--------+--------+--------+------------------+--------+ ICA Prox  179     60              heterogenous               +----------+--------+--------+--------+------------------+--------+ ICA Mid   393     140     80-99%                             +----------+--------+--------+--------+------------------+--------+ ICA Distal516     160                                        +----------+--------+--------+--------+------------------+--------+ ECA       179     32                                          +----------+--------+--------+--------+------------------+--------+ +----------+--------+--------+--------+-------------------+           PSV cm/sEDV cm/sDescribeArm Pressure (mmHG) +----------+--------+--------+--------+-------------------+ [id number]                                         +----------+--------+--------+--------+-------------------+ +---------+--------+--+--------+--+ VertebralPSV cm/s56EDV cm/s12 +---------+--------+--+--------+--+   Summary: Right Carotid: Velocities in the right ICA are consistent with a 1-39% stenosis. Left Carotid: Velocities in the left ICA are consistent with a 80-99% stenosis. Vertebrals:  Bilateral vertebral arteries demonstrate antegrade flow. Subclavians: Normal flow hemodynamics were seen in bilateral subclavian              arteries. *See table(s) above for measurements and observations.  Electronically signed by Monica Martinez MD on 04/15/2020 at 5:34:07 PM.    Final         Scheduled Meds: . aspirin EC  325 mg Oral Daily  . atorvastatin  40 mg Oral Daily  . clopidogrel  75 mg Oral Daily  . enoxaparin (LOVENOX) injection  40 mg Subcutaneous Q24H  . multivitamin with minerals  1 tablet Oral Daily   Continuous Infusions:   Time spent: 25 minutes with over 50% of the time coordinating the patient's care    Harold Hedge, DO [facility] Pager [phone]  Call night coverage person covering after 7pm

## 2020-04-17 NOTE — Progress Notes (Signed)
Samuel George  [id number] DOB: 1951/09/19 DOA: 04/14/2020 PCP: London Pepper, MD    Brief Narrative:  69 year old with a history of HTN who presented to the ED with the acute onset of slurred speech and right facial droop on 5/16.  His wife called EMS who found his blood pressure to be 220/140.  In the ED CTa head and neck revealed a critical proximal left ICA stenosis with string sign and age-indeterminate watershed infarction at the left cerebral white matter.  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  Neurology was consulted.  The patient was started on aspirin 325 mg daily, high intensity statin, and permissive hypertension was practiced.  Significant Events: 5/16 admit via ED 5/17 vascular surgery consulted  Antimicrobials:  None  Subjective: Sitting up in a bedside chair resting comfortably.  Reports complete resolution of presenting symptoms.  No new complaints.  States that he is prepared for his operative procedure tomorrow and has no specific questions.  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 coli UTI Following without antibiotic therapy for now  CKD  stage III Creatinine stable at approximate 1.5  Newly diagnosed prediabetes A1c 5.8 -patient to be educated -will need outpatient follow-up  Chronic diastolic CHF Grade 1 DD with EF 65% per TTE this admit -appears euvolemic on exam   DVT prophylaxis: Lovenox Code Status: FULL CODE Family Communication:  Status is: Inpatient  Remains inpatient appropriate because:Ongoing diagnostic testing needed not appropriate for outpatient work up   Dispo: The patient is from: Home              Anticipated d/c is to: Home              Anticipated d/c date is: 2 days              Patient currently is not medically stable to d/c.  Consultants:  Stroke service Vascular surgery  Objective: Blood pressure (!) 158/88, pulse 62, temperature 98 F (36.7 C), temperature source Oral, resp. rate 18, height 5\' 11"  (1.803 m), weight 98.4 kg, SpO2 98 %. No intake or output data in the 24 hours ending 04/17/20 1008 Filed Weights   04/14/20 1828  Weight: 98.4 kg    Examination: General: No acute respiratory distress Lungs: Clear to auscultation bilaterally without wheezes or crackles Cardiovascular: Regular rate and rhythm without murmur gallop or rub normal S1 and S2 Abdomen: Nontender, nondistended, soft, bowel sounds positive, no rebound, no ascites, no appreciable mass Extremities: No significant cyanosis, clubbing, or edema bilateral lower extremities  CBC: Recent Labs  Lab 04/14/20 1938 04/14/20 2003 04/16/20 0246  WBC 4.5  --  5.6  NEUTROABS 2.8  --   --   HGB 14.2 14.3 13.4  HCT 43.5 42.0  39.8  MCV 96.5  --  94.5  PLT 209  --  0000000   Basic Metabolic Panel: Recent Labs  Lab 04/14/20 1938 04/14/20 2003 04/16/20 0246  NA 136 137 139  K 4.4 4.0 3.7  CL 105 107 107  CO2 20*  --  21*  GLUCOSE 93 90 102*  BUN 30* 31* 28*  CREATININE 1.51* 1.50* 1.55*  CALCIUM 9.0  --  8.8*   GFR: Estimated Creatinine Clearance: 53.8 mL/min (A) (by C-G formula based on SCr of 1.55 mg/dL  (H)).  Liver Function Tests: Recent Labs  Lab 04/14/20 1938  AST 20  ALT 25  ALKPHOS 50  BILITOT 0.4  PROT 7.7  ALBUMIN 4.0    Coagulation Profile: Recent Labs  Lab 04/14/20 1938 04/14/20 2040  INR SPECIMEN CLOTTED 1.0    HbA1C: Hgb A1c MFr Bld  Date/Time Value Ref Range Status  04/15/2020 03:11 AM 5.8 (H) 4.8 - 5.6 % Final    Comment:    (NOTE)         Prediabetes: 5.7 - 6.4         Diabetes: >6.4         Glycemic control for adults with diabetes: <7.0      Recent Results (from the past 240 hour(s))  SARS Coronavirus 2 by RT PCR (hospital order, performed in [place] hospital lab) Nasopharyngeal Nasopharyngeal Swab     Status: None   Collection Time: 04/14/20  9:58 PM   Specimen: Nasopharyngeal Swab  Result Value Ref Range Status   SARS Coronavirus 2 NEGATIVE NEGATIVE Final    Comment: (NOTE) SARS-CoV-2 target nucleic acids are NOT DETECTED. The SARS-CoV-2 RNA is generally detectable in upper and lower respiratory specimens during the acute phase of infection. The lowest concentration of SARS-CoV-2 viral copies this assay can detect is 250 copies / mL. A negative result does not preclude SARS-CoV-2 infection and should not be used as the sole basis for treatment or other patient management decisions.  A negative result may occur with improper specimen collection / handling, submission of specimen other than nasopharyngeal swab, presence of viral mutation(s) within the areas targeted by this assay, and inadequate number of viral copies (<250 copies / mL). A negative result must be combined with clinical observations, patient history, and epidemiological information. Fact Sheet for Patients:   StrictlyIdeas.no Fact Sheet for Healthcare Providers: BankingDealers.co.za This test is not yet approved or cleared  by the [place] FDA and has been authorized for detection and/or diagnosis of SARS-CoV-2 by FDA  under an Emergency Use Authorization (EUA).  This EUA will remain in effect (meaning this test can be used) for the duration of the COVID-19 declaration under Section 564(b)(1) of the Act, 21 U.S.C. section 360bbb-3(b)(1), unless the authorization is terminated or revoked sooner. Performed at [facility], [place] [address], [place], [place] [address]   Culture, Urine     Status: Abnormal   Collection Time: 04/15/20  2:55 AM   Specimen: Urine, Clean Catch  Result Value Ref Range Status   Specimen Description URINE, CLEAN CATCH  Final   Special Requests   Final    NONE Performed at [facility], [place] [address], [place], [place] [address]    Culture >=100,000 COLONIES/mL ESCHERICHIA COLI (A)  Final   Report Status 04/16/2020 FINAL  Final   Organism ID, Bacteria ESCHERICHIA COLI (A)  Final      Susceptibility   Escherichia coli - MIC*  AMPICILLIN <=2 SENSITIVE Sensitive     CEFAZOLIN <=4 SENSITIVE Sensitive     CEFTRIAXONE <=1 SENSITIVE Sensitive     CIPROFLOXACIN <=0.25 SENSITIVE Sensitive     GENTAMICIN <=1 SENSITIVE Sensitive     IMIPENEM <=0.25 SENSITIVE Sensitive     NITROFURANTOIN <=16 SENSITIVE Sensitive     TRIMETH/SULFA <=20 SENSITIVE Sensitive     AMPICILLIN/SULBACTAM <=2 SENSITIVE Sensitive     PIP/TAZO <=4 SENSITIVE Sensitive     * >=100,000 COLONIES/mL ESCHERICHIA COLI     Scheduled Meds: . aspirin EC  325 mg Oral Daily  . atorvastatin  40 mg Oral Daily  . clopidogrel  75 mg Oral Daily  . enoxaparin (LOVENOX) injection  40 mg Subcutaneous Q24H  . multivitamin with minerals  1 tablet Oral Daily     LOS: 3 days   Cherene Altes, MD [facility] Office  [phone] Pager - Text Page per Amion  If 7PM-7AM, please contact night-coverage per Amion 04/17/2020, 10:08 AM

## 2020-04-17 NOTE — Progress Notes (Signed)
Patient scheduled for TCAR 04/18/20 Pre- op am labs, consent and NPO after midnight orders placed

## 2020-04-17 NOTE — Progress Notes (Signed)
[facility]  Subjective  - no new neurologic events overnight.     Objective (!) 158/88 62 98 F (36.7 C) (Oral) 18 98% No intake or output data in the 24 hours ending 04/17/20 0935  Neuro intact.  No appreciable deficits.  Laboratory Lab Results: Recent Labs    04/14/20 1938 04/14/20 1938 04/14/20 2003 04/16/20 0246  WBC 4.5  --   --  5.6  HGB 14.2   < > 14.3 13.4  HCT 43.5   < > 42.0 39.8  PLT 209  --   --  233   < > = values in this interval not displayed.   BMET Recent Labs    04/14/20 1938 04/14/20 1938 04/14/20 2003 04/16/20 0246  NA 136   < > 137 139  K 4.4   < > 4.0 3.7  CL 105   < > 107 107  CO2 20*  --   --  21*  GLUCOSE 93   < > 90 102*  BUN 30*   < > 31* 28*  CREATININE 1.51*   < > 1.50* 1.55*  CALCIUM 9.0  --   --  8.8*   < > = values in this interval not displayed.    COAG Lab Results  Component Value Date   INR 1.0 04/14/2020   INR SPECIMEN CLOTTED 04/14/2020   No results found for: PTT  Assessment/Planning:  69 year old male with high-grade left ICA stenosis and symptomatic lesion given MRI findings.  After review of CTA as well as duplex, I have recommended left TCAR scheduled for tomorrow.  Please keep NPO after midnight.  Concern is that difficult to evaluate distal extent of the lesion on CTA given underfilling and duplex suggested disease extending well into the mid ICA and even elevated velocities more distal.  Fair amount of soft thrombus.  Risks and benefits discussed in detail including 1% risk perioperative stroke.  Continue dual antiplatelet therapy and statin.     Marty Heck 04/17/2020 9:35 AM --

## 2020-04-18 ENCOUNTER — Encounter (HOSPITAL_COMMUNITY): Payer: Self-pay | Admitting: Internal Medicine

## 2020-04-18 ENCOUNTER — Encounter (HOSPITAL_COMMUNITY)
Admission: EM | Disposition: A | Discharge status: Home / Self Care | Payer: Self-pay | Source: Home / Self Care | Attending: Internal Medicine

## 2020-04-18 ENCOUNTER — Inpatient Hospital Stay (HOSPITAL_COMMUNITY): Payer: Medicare Other

## 2020-04-18 ENCOUNTER — Ambulatory Visit: Payer: Medicare Other

## 2020-04-18 ENCOUNTER — Inpatient Hospital Stay (HOSPITAL_COMMUNITY): Payer: Medicare Other | Admitting: Certified Registered Nurse Anesthetist

## 2020-04-18 DIAGNOSIS — I6522 Occlusion and stenosis of left carotid artery: Secondary | ICD-10-CM

## 2020-04-18 HISTORY — PX: TRANSCAROTID ARTERY REVASCULARIZATIONÂ: SHX6778

## 2020-04-18 LAB — CBC
HCT: 38.5 % — ABNORMAL LOW (ref 39.0–52.0)
Hemoglobin: 12.7 g/dL — ABNORMAL LOW (ref 13.0–17.0)
MCH: 31.6 pg (ref 26.0–34.0)
MCHC: 33 g/dL (ref 30.0–36.0)
MCV: 95.8 fL (ref 80.0–100.0)
Platelets: 232 10*3/uL (ref 150–400)
RBC: 4.02 MIL/uL — ABNORMAL LOW (ref 4.22–5.81)
RDW: 12 % (ref 11.5–15.5)
WBC: 5.2 10*3/uL (ref 4.0–10.5)
nRBC: 0 % (ref 0.0–0.2)

## 2020-04-18 LAB — ABO/RH: ABO/RH(D): A POS

## 2020-04-18 LAB — BASIC METABOLIC PANEL
Anion gap: 9 (ref 5–15)
BUN: 28 mg/dL — ABNORMAL HIGH (ref 8–23)
CO2: 24 mmol/L (ref 22–32)
Calcium: 8.9 mg/dL (ref 8.9–10.3)
Chloride: 109 mmol/L (ref 98–111)
Creatinine, Ser: 1.46 mg/dL — ABNORMAL HIGH (ref 0.61–1.24)
GFR calc Af Amer: 56 mL/min — ABNORMAL LOW (ref >60)
GFR calc non Af Amer: 48 mL/min — ABNORMAL LOW (ref >60)
Glucose, Bld: 96 mg/dL (ref 70–99)
Potassium: 4.2 mmol/L (ref 3.5–5.1)
Sodium: 142 mmol/L (ref 135–145)

## 2020-04-18 LAB — TYPE AND SCREEN
ABO/RH(D): A POS
Antibody Screen: NEGATIVE

## 2020-04-18 SURGERY — TRANSCAROTID ARTERY REVASCULARIZATION (TCAR)
Anesthesia: General | Laterality: Left

## 2020-04-18 MED ORDER — LIDOCAINE 2% (20 MG/ML) 5 ML SYRINGE
INTRAMUSCULAR | Status: AC
  Filled 2020-04-18: qty 5

## 2020-04-18 MED ORDER — SODIUM CHLORIDE 0.9 % IV SOLN
INTRAVENOUS | Status: DC | PRN
  Administered 2020-04-18: 500 mL

## 2020-04-18 MED ORDER — FENTANYL CITRATE (PF) 100 MCG/2ML IJ SOLN: 25.0000 ug | INTRAMUSCULAR | Status: DC | PRN

## 2020-04-18 MED ORDER — PROPOFOL 10 MG/ML IV BOLUS
INTRAVENOUS | Status: DC | PRN
  Administered 2020-04-18: 70 mg via INTRAVENOUS
  Administered 2020-04-18: 130 mg via INTRAVENOUS

## 2020-04-18 MED ORDER — PROPOFOL 10 MG/ML IV BOLUS
INTRAVENOUS | Status: AC
  Filled 2020-04-18: qty 20

## 2020-04-18 MED ORDER — CEFAZOLIN SODIUM 1 G IJ SOLR
INTRAMUSCULAR | Status: AC
  Filled 2020-04-18: qty 20

## 2020-04-18 MED ORDER — PHENYLEPHRINE 40 MCG/ML (10ML) SYRINGE FOR IV PUSH (FOR BLOOD PRESSURE SUPPORT)
PREFILLED_SYRINGE | INTRAVENOUS | Status: AC
  Filled 2020-04-18: qty 10

## 2020-04-18 MED ORDER — PHENOL 1.4 % MT LIQD: 1.0000 | OROMUCOSAL | Status: DC | PRN

## 2020-04-18 MED ORDER — LACTATED RINGERS IV SOLN: INTRAVENOUS | Status: DC

## 2020-04-18 MED ORDER — FENTANYL CITRATE (PF) 250 MCG/5ML IJ SOLN
INTRAMUSCULAR | Status: AC
  Filled 2020-04-18: qty 5

## 2020-04-18 MED ORDER — SODIUM CHLORIDE 0.9 % IV SOLN: INTRAVENOUS | Status: DC

## 2020-04-18 MED ORDER — HYDRALAZINE HCL 20 MG/ML IJ SOLN
10.0000 mg | Freq: Once | INTRAMUSCULAR | Status: AC
  Administered 2020-04-18: 10 mg via INTRAVENOUS

## 2020-04-18 MED ORDER — HYDRALAZINE HCL 20 MG/ML IJ SOLN
INTRAMUSCULAR | Status: AC
  Filled 2020-04-18: qty 1

## 2020-04-18 MED ORDER — PANTOPRAZOLE SODIUM 40 MG PO TBEC
40.0000 mg | DELAYED_RELEASE_TABLET | Freq: Every day | ORAL | Status: DC
  Administered 2020-04-18 – 2020-04-19 (×2): 40 mg via ORAL
  Filled 2020-04-18 (×2): qty 1

## 2020-04-18 MED ORDER — HYDRALAZINE HCL 20 MG/ML IJ SOLN
5.0000 mg | Freq: Once | INTRAMUSCULAR | Status: AC
  Administered 2020-04-18: 5 mg via INTRAVENOUS

## 2020-04-18 MED ORDER — GUAIFENESIN-DM 100-10 MG/5ML PO SYRP: 15.0000 mL | ORAL_SOLUTION | ORAL | Status: DC | PRN

## 2020-04-18 MED ORDER — ONDANSETRON HCL 4 MG/2ML IJ SOLN
INTRAMUSCULAR | Status: DC | PRN
  Administered 2020-04-18: 4 mg via INTRAVENOUS

## 2020-04-18 MED ORDER — NICARDIPINE HCL IN NACL 20-0.86 MG/200ML-% IV SOLN
INTRAVENOUS | Status: AC
  Filled 2020-04-18: qty 200

## 2020-04-18 MED ORDER — PHENYLEPHRINE 40 MCG/ML (10ML) SYRINGE FOR IV PUSH (FOR BLOOD PRESSURE SUPPORT)
PREFILLED_SYRINGE | INTRAVENOUS | Status: DC | PRN
  Administered 2020-04-18: 120 ug via INTRAVENOUS

## 2020-04-18 MED ORDER — CEFAZOLIN SODIUM-DEXTROSE 2-4 GM/100ML-% IV SOLN
2.0000 g | Freq: Three times a day (TID) | INTRAVENOUS | Status: AC
  Administered 2020-04-18 – 2020-04-19 (×2): 2 g via INTRAVENOUS
  Filled 2020-04-18 (×3): qty 100

## 2020-04-18 MED ORDER — HEPARIN SODIUM (PORCINE) 1000 UNIT/ML IJ SOLN
INTRAMUSCULAR | Status: AC
  Filled 2020-04-18: qty 2

## 2020-04-18 MED ORDER — 0.9 % SODIUM CHLORIDE (POUR BTL) OPTIME
TOPICAL | Status: DC | PRN
  Administered 2020-04-18: 1000 mL

## 2020-04-18 MED ORDER — LABETALOL HCL 5 MG/ML IV SOLN
10.0000 mg | Freq: Once | INTRAVENOUS | Status: AC
  Administered 2020-04-18: 10 mg via INTRAVENOUS

## 2020-04-18 MED ORDER — CEFAZOLIN SODIUM-DEXTROSE 1-4 GM/50ML-% IV SOLN
INTRAVENOUS | Status: DC | PRN
  Administered 2020-04-18: 2 g via INTRAVENOUS

## 2020-04-18 MED ORDER — SODIUM CHLORIDE 0.9 % IV SOLN
INTRAVENOUS | Status: AC
  Filled 2020-04-18: qty 1.2

## 2020-04-18 MED ORDER — SODIUM CHLORIDE 0.9 % IV SOLN: INTRAVENOUS | Status: DC | PRN

## 2020-04-18 MED ORDER — POTASSIUM CHLORIDE CRYS ER 20 MEQ PO TBCR: 20.0000 meq | EXTENDED_RELEASE_TABLET | Freq: Every day | ORAL | Status: DC | PRN

## 2020-04-18 MED ORDER — LIDOCAINE 2% (20 MG/ML) 5 ML SYRINGE
INTRAMUSCULAR | Status: DC | PRN
  Administered 2020-04-18: 40 mg via INTRAVENOUS

## 2020-04-18 MED ORDER — MORPHINE SULFATE (PF) 2 MG/ML IV SOLN: 2.0000 mg | INTRAVENOUS | Status: DC | PRN

## 2020-04-18 MED ORDER — OXYCODONE-ACETAMINOPHEN 5-325 MG PO TABS: 1.0000 | ORAL_TABLET | ORAL | Status: DC | PRN

## 2020-04-18 MED ORDER — SUGAMMADEX SODIUM 200 MG/2ML IV SOLN
INTRAVENOUS | Status: DC | PRN
  Administered 2020-04-18: 200 mg via INTRAVENOUS

## 2020-04-18 MED ORDER — HEPARIN SODIUM (PORCINE) 1000 UNIT/ML IJ SOLN
INTRAMUSCULAR | Status: DC | PRN
  Administered 2020-04-18: 10000 [IU] via INTRAVENOUS

## 2020-04-18 MED ORDER — LABETALOL HCL 5 MG/ML IV SOLN
10.0000 mg | INTRAVENOUS | Status: DC | PRN
  Administered 2020-04-18 (×2): 10 mg via INTRAVENOUS
  Filled 2020-04-18: qty 4

## 2020-04-18 MED ORDER — ALUM & MAG HYDROXIDE-SIMETH 200-200-20 MG/5ML PO SUSP: 15.0000 mL | ORAL | Status: DC | PRN

## 2020-04-18 MED ORDER — HYDRALAZINE HCL 20 MG/ML IJ SOLN: 5.0000 mg | INTRAMUSCULAR | Status: DC | PRN

## 2020-04-18 MED ORDER — LABETALOL HCL 5 MG/ML IV SOLN: 10.0000 mg | INTRAVENOUS | Status: DC | PRN

## 2020-04-18 MED ORDER — DOCUSATE SODIUM 100 MG PO CAPS
100.0000 mg | ORAL_CAPSULE | Freq: Every day | ORAL | Status: DC
  Administered 2020-04-19: 100 mg via ORAL
  Filled 2020-04-18: qty 1

## 2020-04-18 MED ORDER — LIDOCAINE HCL (PF) 1 % IJ SOLN
INTRAMUSCULAR | Status: AC
  Filled 2020-04-18: qty 30

## 2020-04-18 MED ORDER — METOPROLOL TARTRATE 5 MG/5ML IV SOLN: 2.0000 mg | INTRAVENOUS | Status: DC | PRN

## 2020-04-18 MED ORDER — NOREPINEPHRINE 4 MG/250ML-% IV SOLN
INTRAVENOUS | Status: DC | PRN
  Administered 2020-04-18: 2 ug/min via INTRAVENOUS

## 2020-04-18 MED ORDER — MAGNESIUM SULFATE 2 GM/50ML IV SOLN: 2.0000 g | Freq: Every day | INTRAVENOUS | Status: DC | PRN

## 2020-04-18 MED ORDER — ARTIFICIAL TEARS OPHTHALMIC OINT
TOPICAL_OINTMENT | OPHTHALMIC | Status: AC
  Filled 2020-04-18: qty 3.5

## 2020-04-18 MED ORDER — CLEVIDIPINE BUTYRATE 0.5 MG/ML IV EMUL
INTRAVENOUS | Status: DC | PRN
Start: 2020-04-18 — End: 2020-04-18
  Administered 2020-04-18: 2 mg/h via INTRAVENOUS

## 2020-04-18 MED ORDER — PROTAMINE SULFATE 10 MG/ML IV SOLN
INTRAVENOUS | Status: AC
  Filled 2020-04-18: qty 5

## 2020-04-18 MED ORDER — ROCURONIUM BROMIDE 10 MG/ML (PF) SYRINGE
PREFILLED_SYRINGE | INTRAVENOUS | Status: DC | PRN
  Administered 2020-04-18: 100 mg via INTRAVENOUS
  Administered 2020-04-18: 50 mg via INTRAVENOUS

## 2020-04-18 MED ORDER — ROCURONIUM BROMIDE 10 MG/ML (PF) SYRINGE
PREFILLED_SYRINGE | INTRAVENOUS | Status: AC
  Filled 2020-04-18: qty 10

## 2020-04-18 MED ORDER — ONDANSETRON HCL 4 MG/2ML IJ SOLN: 4.0000 mg | Freq: Four times a day (QID) | INTRAMUSCULAR | Status: DC | PRN

## 2020-04-18 MED ORDER — HEMOSTATIC AGENTS (NO CHARGE) OPTIME
TOPICAL | Status: DC | PRN
  Administered 2020-04-18: 1 via TOPICAL

## 2020-04-18 MED ORDER — NICARDIPINE HCL 2.5 MG/ML IV SOLN
4000.0000 ug | Freq: Once | INTRAVENOUS | Status: AC
  Administered 2020-04-18: 4000 ug via INTRAVENOUS

## 2020-04-18 MED ORDER — GLYCOPYRROLATE PF 0.2 MG/ML IJ SOSY
PREFILLED_SYRINGE | INTRAMUSCULAR | Status: DC | PRN
  Administered 2020-04-18 (×2): .2 mg via INTRAVENOUS

## 2020-04-18 MED ORDER — IODIXANOL 320 MG/ML IV SOLN
INTRAVENOUS | Status: DC | PRN
  Administered 2020-04-18: 24 mL via INTRA_ARTERIAL

## 2020-04-18 MED ORDER — PROTAMINE SULFATE 10 MG/ML IV SOLN
INTRAVENOUS | Status: DC | PRN
Start: 2020-04-18 — End: 2020-04-18
  Administered 2020-04-18: 50 mg via INTRAVENOUS

## 2020-04-18 MED ORDER — LABETALOL HCL 5 MG/ML IV SOLN
INTRAVENOUS | Status: AC
  Filled 2020-04-18: qty 4

## 2020-04-18 MED ORDER — FENTANYL CITRATE (PF) 250 MCG/5ML IJ SOLN
INTRAMUSCULAR | Status: DC | PRN
  Administered 2020-04-18: 50 ug via INTRAVENOUS
  Administered 2020-04-18 (×2): 25 ug via INTRAVENOUS
  Administered 2020-04-18 (×3): 50 ug via INTRAVENOUS

## 2020-04-18 MED ORDER — DEXAMETHASONE SODIUM PHOSPHATE 10 MG/ML IJ SOLN
INTRAMUSCULAR | Status: DC | PRN
  Administered 2020-04-18: 8 mg via INTRAVENOUS

## 2020-04-18 SURGICAL SUPPLY — 85 items
ADH SKN CLS APL DERMABOND .7 (GAUZE/BANDAGES/DRESSINGS) ×2
ADPR TBG 2 MALE LL ART (MISCELLANEOUS)
BAG BANDED W/RUBBER/TAPE 36X54 (MISCELLANEOUS) ×3 IMPLANT
BAG EQP BAND 135X91 W/RBR TAPE (MISCELLANEOUS) ×1
BALLN STERLING RX 5X30X80 (BALLOONS) ×3
BALLOON STERLING RX 5X30X80 (BALLOONS) ×1 IMPLANT
CANISTER SUCT 3000ML PPV (MISCELLANEOUS) ×3 IMPLANT
CATH ROBINSON RED A/P 18FR (CATHETERS) IMPLANT
CLIP VESOCCLUDE MED 6/CT (CLIP) ×3 IMPLANT
CLIP VESOCCLUDE SM WIDE 6/CT (CLIP) ×3 IMPLANT
COVER DOME SNAP 22 D (MISCELLANEOUS) ×3 IMPLANT
COVER PROBE W GEL 5X96 (DRAPES) ×3 IMPLANT
COVER WAND RF STERILE (DRAPES) ×3 IMPLANT
DERMABOND ADVANCED (GAUZE/BANDAGES/DRESSINGS) ×4
DERMABOND ADVANCED .7 DNX12 (GAUZE/BANDAGES/DRESSINGS) ×2 IMPLANT
DRAIN CHANNEL 15F RND FF W/TCR (WOUND CARE) IMPLANT
DRAPE INCISE IOBAN 66X45 STRL (DRAPES) ×3 IMPLANT
DRSG TEGADERM 2-3/8X2-3/4 SM (GAUZE/BANDAGES/DRESSINGS) ×2 IMPLANT
ELECT REM PT RETURN 9FT ADLT (ELECTROSURGICAL) ×3
ELECTRODE REM PT RTRN 9FT ADLT (ELECTROSURGICAL) ×1 IMPLANT
EVACUATOR SILICONE 100CC (DRAIN) IMPLANT
GAUZE SPONGE 2X2 8PLY STRL LF (GAUZE/BANDAGES/DRESSINGS) ×1 IMPLANT
GLOVE BIO SURGEON STRL SZ7.5 (GLOVE) ×3 IMPLANT
GLOVE BIOGEL PI IND STRL 6.5 (GLOVE) IMPLANT
GLOVE BIOGEL PI IND STRL 7.0 (GLOVE) IMPLANT
GLOVE BIOGEL PI INDICATOR 6.5 (GLOVE) ×2
GLOVE BIOGEL PI INDICATOR 7.0 (GLOVE) ×4
GLOVE ECLIPSE 7.5 STRL STRAW (GLOVE) ×4 IMPLANT
GLOVE SS BIOGEL STRL SZ 6.5 (GLOVE) IMPLANT
GLOVE SUPERSENSE BIOGEL SZ 6.5 (GLOVE) ×2
GOWN STRL NON-REIN LRG LVL3 (GOWN DISPOSABLE) ×2 IMPLANT
GOWN STRL REUS W/ TWL LRG LVL3 (GOWN DISPOSABLE) ×2 IMPLANT
GOWN STRL REUS W/ TWL XL LVL3 (GOWN DISPOSABLE) ×1 IMPLANT
GOWN STRL REUS W/TWL LRG LVL3 (GOWN DISPOSABLE) ×6
GOWN STRL REUS W/TWL XL LVL3 (GOWN DISPOSABLE) ×3
GUIDEWIRE ENROUTE 0.014 (WIRE) ×3 IMPLANT
HEMOSTAT SNOW SURGICEL 2X4 (HEMOSTASIS) ×2 IMPLANT
INSERT FOGARTY SM (MISCELLANEOUS) IMPLANT
INTRODUCER KIT GALT 7CM (INTRODUCER) ×3
IV ADAPTER SYR DOUBLE MALE LL (MISCELLANEOUS) IMPLANT
KIT BASIN OR (CUSTOM PROCEDURE TRAY) ×3 IMPLANT
KIT ENCORE 26 ADVANTAGE (KITS) ×3 IMPLANT
KIT INTRODUCER GALT 7 (INTRODUCER) ×1 IMPLANT
KIT TURNOVER KIT B (KITS) ×3 IMPLANT
NDL HYPO 25GX1X1/2 BEV (NEEDLE) IMPLANT
NDL PERC 18GX7CM (NEEDLE) ×1 IMPLANT
NEEDLE HYPO 25GX1X1/2 BEV (NEEDLE) IMPLANT
NEEDLE PERC 18GX7CM (NEEDLE) ×3 IMPLANT
NEEDLE SPNL 20GX3.5 QUINCKE YW (NEEDLE) IMPLANT
PACK CAROTID (CUSTOM PROCEDURE TRAY) ×3 IMPLANT
PACK UNIVERSAL I (CUSTOM PROCEDURE TRAY) ×3 IMPLANT
PAD ARMBOARD 7.5X6 YLW CONV (MISCELLANEOUS) ×6 IMPLANT
POSITIONER HEAD DONUT 9IN (MISCELLANEOUS) ×3 IMPLANT
PROTECTION STATION PRESSURIZED (MISCELLANEOUS) ×3
SET MICROPUNCTURE 5F STIFF (MISCELLANEOUS) ×3 IMPLANT
SHEATH PINNACLE 5FR (SHEATH) IMPLANT
SHUNT CAROTID BYPASS 10 (VASCULAR PRODUCTS) IMPLANT
SHUNT CAROTID BYPASS 12FRX15.5 (VASCULAR PRODUCTS) IMPLANT
SPONGE GAUZE 2X2 STER 10/PKG (GAUZE/BANDAGES/DRESSINGS) ×2
STATION PROTECTION PRESSURIZED (MISCELLANEOUS) ×1 IMPLANT
STENT TRANSCAROTID SYS 10X40 (Permanent Stent) ×3 IMPLANT
STOPCOCK 4 WAY LG BORE MALE ST (IV SETS) ×3 IMPLANT
SUT ETHILON 3 0 PS 1 (SUTURE) IMPLANT
SUT MNCRL AB 4-0 PS2 18 (SUTURE) ×3 IMPLANT
SUT PROLENE 5 0 C 1 24 (SUTURE) ×12 IMPLANT
SUT PROLENE 6 0 BV (SUTURE) IMPLANT
SUT PROLENE 7 0 BV 1 (SUTURE) IMPLANT
SUT SILK 2 0 PERMA HAND 18 BK (SUTURE) ×3 IMPLANT
SUT SILK 2 0 SH CR/8 (SUTURE) ×3 IMPLANT
SUT SILK 3 0 (SUTURE)
SUT SILK 3-0 18XBRD TIE 12 (SUTURE) IMPLANT
SUT VIC AB 3-0 SH 27 (SUTURE) ×3
SUT VIC AB 3-0 SH 27X BRD (SUTURE) ×1 IMPLANT
SYR 10ML LL (SYRINGE) ×9 IMPLANT
SYR 20ML LL LF (SYRINGE) ×3 IMPLANT
SYR 5ML LL (SYRINGE) ×3 IMPLANT
SYR CONTROL 10ML LL (SYRINGE) IMPLANT
SYSTEM TRANSCAROTID NEUROPRTCT (MISCELLANEOUS) IMPLANT
TOWEL GREEN STERILE (TOWEL DISPOSABLE) ×3 IMPLANT
TRANSCAROTID NEUROPROTECT SYS (MISCELLANEOUS) ×3
TUBING ART PRESS 48 MALE/FEM (TUBING) IMPLANT
TUBING EXTENTION W/L.L. (IV SETS) IMPLANT
WATER STERILE IRR 1000ML POUR (IV SOLUTION) ×3 IMPLANT
WIRE AMPLATZ SS-J .035X180CM (WIRE) ×2 IMPLANT
WIRE BENTSON .035X145CM (WIRE) ×3 IMPLANT

## 2020-04-18 NOTE — Progress Notes (Signed)
  Progress Note    04/18/2020 5:37 PM Day of Surgery  Subjective:  Seen in recovery room. Resting comfortably with no complaints   Vitals:   04/18/20 1700 04/18/20 1715  BP: (!) 126/53 (!) 115/57  Pulse: 78 84  Resp: 18 20  Temp: 97.8 F (36.6 C)   SpO2: 97% 94%   Physical Exam: Cardiac:  regular Lungs:  Non labored Incisions: right groin access site clean, dry and intact without hematoma. Small hematoma at left supraclavicular incision. Clean dry and intact. Extremities:  Moving all extremities with out deficits. Well perfused and warm Abdomen:  Soft, non distended Neurologic: alert and oriented. CN intact  CBC    Component Value Date/Time   WBC 5.2 04/18/2020 0233   RBC 4.02 (L) 04/18/2020 0233   HGB 12.7 (L) 04/18/2020 0233   HCT 38.5 (L) 04/18/2020 0233   PLT 232 04/18/2020 0233   MCV 95.8 04/18/2020 0233   MCH 31.6 04/18/2020 0233   MCHC 33.0 04/18/2020 0233   RDW 12.0 04/18/2020 0233   LYMPHSABS 1.2 04/14/2020 1938   MONOABS 0.4 04/14/2020 1938   EOSABS 0.1 04/14/2020 1938   BASOSABS 0.0 04/14/2020 1938    BMET    Component Value Date/Time   NA 142 04/18/2020 0233   NA 137 03/28/2020 0916   K 4.2 04/18/2020 0233   CL 109 04/18/2020 0233   CO2 24 04/18/2020 0233   GLUCOSE 96 04/18/2020 0233   BUN 28 (H) 04/18/2020 0233   BUN 24 03/28/2020 0916   CREATININE 1.46 (H) 04/18/2020 0233   CALCIUM 8.9 04/18/2020 0233   GFRNONAA 48 (L) 04/18/2020 0233   GFRAA 56 (L) 04/18/2020 0233    INR    Component Value Date/Time   INR 1.0 04/14/2020 2040     Intake/Output Summary (Last 24 hours) at 04/18/2020 1737 Last data filed at 04/18/2020 1600 Gross per 24 hour  Intake 1085 ml  Output 50 ml  Net 1035 ml     Assessment/Plan:  68 y.o. male is s/p left TCAR  Day of Surgery. Doing well post op. Neurologically intact. He has remained hypertensive post operatively via aline with SBP in the 170-180s. IV Cleviprex and prn labetalol and Cardene given. Will  continue to monitor. Hopefully to 4E this evening  Karoline Caldwell, [place] [facility] [phone] 04/18/2020 5:37 PM

## 2020-04-18 NOTE — Progress Notes (Signed)
[facility]  Subjective  - no new neurologic events overnight.     Objective (!) 155/89 60 97.8 F (36.6 C) (Oral) 18 100%  Intake/Output Summary (Last 24 hours) at 04/18/2020 1157 Last data filed at 04/17/2020 1300 Gross per 24 hour  Intake 320 ml  Output -  Net 320 ml    Neuro intact.  No appreciable deficits.  Laboratory Lab Results: Recent Labs    04/16/20 0246 04/18/20 0233  WBC 5.6 5.2  HGB 13.4 12.7*  HCT 39.8 38.5*  PLT 233 232   BMET Recent Labs    04/16/20 0246 04/18/20 0233  NA 139 142  K 3.7 4.2  CL 107 109  CO2 21* 24  GLUCOSE 102* 96  BUN 28* 28*  CREATININE 1.55* 1.46*  CALCIUM 8.8* 8.9    COAG Lab Results  Component Value Date   INR 1.0 04/14/2020   INR SPECIMEN CLOTTED 04/14/2020   No results found for: PTT  Assessment/Planning:  69 year old male with high-grade left ICA stenosis and symptomatic lesion given MRI findings.  After review of CTA as well as duplex, plan for left TCAR after risks and benefits reviewed again  Including 1% risk perioperative stroke.  Continue dual antiplatelet therapy and statin.     Marty Heck 04/18/2020 11:57 AM --

## 2020-04-18 NOTE — Transfer of Care (Signed)
Immediate Anesthesia Transfer of Care Note  Patient: Samuel George  Procedure(s) Performed: TRANSCAROTID ARTERY REVASCULARIZATION LEFT (Left )  Patient Location: PACU  Anesthesia Type:General  Level of Consciousness: awake, alert , oriented, patient cooperative and responds to stimulation  Airway & Oxygen Therapy: Patient Spontanous Breathing and Patient connected to nasal cannula oxygen  Post-op Assessment: Report given to RN, Post -op Vital signs reviewed and stable, Patient moving all extremities X 4 and Patient able to stick tongue midline  Post vital signs: Reviewed and stable  Last Vitals:  Vitals Value Taken Time  BP 161/68 04/18/20 1445  Temp    Pulse 72 04/18/20 1451  Resp 12 04/18/20 1451  SpO2 100 % 04/18/20 1451  Vitals shown include unvalidated device data.  Last Pain:  Vitals:   04/18/20 1112  TempSrc:   PainSc: 0-No pain         Complications: No apparent anesthesia complications

## 2020-04-18 NOTE — Anesthesia Preprocedure Evaluation (Addendum)
Anesthesia Evaluation  Patient identified by MRN, date of birth, ID band Patient awake    Reviewed: Allergy & Precautions, NPO status , Patient's Chart, lab work & pertinent test results, reviewed documented beta blocker date and time   Airway Mallampati: III  TM Distance: >3 FB Neck ROM: Full    Dental no notable dental hx. (+) Teeth Intact, Dental Advisory Given   Pulmonary neg pulmonary ROS,    Pulmonary exam normal breath sounds clear to auscultation       Cardiovascular hypertension, Pt. on medications and Pt. on home beta blockers Normal cardiovascular exam Rhythm:Regular Rate:Normal  TTE 2021 1. Left ventricular ejection fraction, by estimation, is 60 to 65%. The left ventricle has normal function. The left ventricle has no regional wall motion abnormalities. There is mild concentric left ventricular hypertrophy. Left ventricular diastolic parameters are consistent with Grade I diastolic dysfunction (impaired relaxation).  2. Right ventricular systolic function is normal. The right ventricular size is normal. There is normal pulmonary artery systolic pressure.  3. Left atrial size was mildly dilated.  4. The mitral valve is normal in structure. Trivial mitral valve regurgitation. No evidence of mitral stenosis.  5. The aortic valve is tricuspid. Aortic valve regurgitation is not visualized. No aortic stenosis is present.  6. The inferior vena cava is normal in size with greater than 50% respiratory variability, suggesting right atrial pressure of 3 mmHg.   Stress Test 2021 The left ventricular ejection fraction is moderately decreased (30-44%). Nuclear stress EF: 34%. There was no ST segment deviation noted during stress. The study is normal. This is a low risk study.   Neuro/Psych CVA negative psych ROS   GI/Hepatic negative GI ROS, Neg liver ROS,   Endo/Other  negative endocrine ROS  Renal/GU Renal  InsufficiencyRenal disease (Cr 1.46, K 4.2)  negative genitourinary   Musculoskeletal negative musculoskeletal ROS (+)   Abdominal   Peds  Hematology  (+) Blood dyscrasia (Hgb 12.7), ,   Anesthesia Other Findings Presented to ED on 04/14/20 with episode of slurred speech and R facial droop. CTA head and neck reveals: 1) critical proximal L ICA stenosis with string sign 2) age indeterminate watershed infarcts in deep L cerebral white matter.  Reproductive/Obstetrics                            Anesthesia Physical Anesthesia Plan  ASA: III  Anesthesia Plan: General   Post-op Pain Management:    Induction: Intravenous  PONV Risk Score and Plan: 2 and Midazolam, Dexamethasone and Ondansetron  Airway Management Planned: Oral ETT  Additional Equipment: Arterial line  Intra-op Plan:   Post-operative Plan: Extubation in OR  Informed Consent: I have reviewed the patients History and Physical, chart, labs and discussed the procedure including the risks, benefits and alternatives for the proposed anesthesia with the patient or authorized representative who has indicated his/her understanding and acceptance.     Dental advisory given  Plan Discussed with: CRNA  Anesthesia Plan Comments:         Anesthesia Quick Evaluation

## 2020-04-18 NOTE — Op Note (Signed)
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  Ultrasound-guided access of the right common femoral vein for placement of TCAR flow reversal venous sheath 2.  Transcatheter placement of intravascular stent in the left carotid artery including angioplasty (left TCAR using 10 mm x 40 mm Enroute stent)  Surgeon: Dr. 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  On further work-up had a high-grade left ICA stenosis that extended fairly distal into the mid ICA with string sign.  He presents today for planned TCAR after risk benefits discussed.  Findings: After cutdown on the left common carotid artery just above the clavicle and placement of percutaneous sheath in the artery the lesion was crossed and predilated with a 5 mm x 30 mm angioplasty balloon and then primarily stented with a 10 mm x 40 mm Enroute stent.  We did post-dilate the stent with a 5 mm balloon that was over dilated given more than 30% residual stenosis in the stent after deployment.  Widely patent stent at completion.  Total flow reversal was 12 minutes and 7 seconds.  Anesthesia: General  Details: Patient was taken to the operating room after informed consent was obtained.  Placed on operative table in supine position.  The left neck and both groins were then prepped and draped in usual sterile fashion.  He did get preoperative antibiotics.  Timeout was performed.  Initially used ultrasound evaluated the right common femoral vein that was patent and image was saved.  I then placed a micro access needle with a microwire and ultimately a Bentson wire and then placed the TCAR venous flow reversal sheath in the right common  femoral vein and had good aspiration of venous blood.  Then turned our attention to the left neck.  Transverse incision was made 1 fingerbreadth above the clavicle cutdown and with Bovie cautery and opened the platysma.  We then found the head of the sternocleidomastoid and muscle-sparing was used to find a plane between the sternal and clavicular heads.  Dissected down and the internal jugular vein was mobilized laterally the vagus nerve was protected medially and we got circumferential control of the left common carotid artery.  5-0 Prolene pursestring suture was placed in the artery.  This was then accessed percutaneously with micro access needle placed a microwire and then a micro sheath.  That point time patient was given 100 units per kilogram heparin.  We then got multiple images of the left carotid bifurcation to define the anatomy as well as the lesion in the ICA.  We then marked bifurcation placed our stiffer J-wire in the common carotid artery below the lesion and ultimately the Enroute flow reversal sheath was placed in the common carotid artery on the left.  This sheath was then secured.  We then connected our filter system to the venous flow reversal sheath had good flow through the filter system.  That point time we went ahead and got several additional views once the larger sheath was placed to ensure no dissection and this looked good as well as marking the bifurcation and lesion.  Patient was given glycopyrrolate and a TCAR timeout was performed to ensure HR and BP were appropriate as well as therapeutic ACT.  Ultimately the lesion in the ICA which was fairly lengthy at over 20 to 30 mm was then crossed and then predilated with a 5 mm x 30 mm angioplasty balloon to form.  I then exchanged the balloon over the wire for our Enroute stent and a 10 mm x 40 mm Enroute stent was stented across the ICA into the distal common carotid.  There appeared to be more than 30% residual stenosis in the stents that  we then performed post stent angioplasty within the stent using the 5 mm balloon that was oversized.  Final injection showed stabilization of the lesion in the ICA with a patent ICA stent.  There was some spasm distally we did see flow into the base of the skull through the ICA.  That point time the Enroute flow vessel sheath was disconnected.  The sheath in the common carotid artery was then removed and we tied down the pursestring suture.  Had to place one additional 5-0 Prolene.  That point time a Doppler probe was used and had good flow in the common carotid with good pulse.  50 mg protamine was given for reversal.  The venous sheath in the right groin was then removed and manual pressure was held.  Hemostasis was obtained in the neck incision using Surgicel snow and this it was irrigated out.  When the platysma closed with 3-0 Vicryl skin was closed with 4-0 Monocryl Dermabond was applied.  Complication: None  Condition: Stable  Marty Heck, MD [facility] Office: [place]

## 2020-04-18 NOTE — Anesthesia Procedure Notes (Signed)
Procedure Name: Intubation Date/Time: 04/18/2020 12:31 PM Performed by: Verdie Drown, CRNA Pre-anesthesia Checklist: Patient identified, Emergency Drugs available, Suction available and Patient being monitored Patient Re-evaluated:Patient Re-evaluated prior to induction Oxygen Delivery Method: Circle System Utilized Preoxygenation: Pre-oxygenation with 100% oxygen Induction Type: IV induction Ventilation: Mask ventilation without difficulty Laryngoscope Size: Mac and 3 Grade View: Grade III Tube type: Oral Tube size: 7.5 mm Number of attempts: 1 Airway Equipment and Method: Stylet and Oral airway Placement Confirmation: ETT inserted through vocal cords under direct vision,  positive ETCO2 and breath sounds checked- equal and bilateral Secured at: 22 cm Tube secured with: Tape Dental Injury: Teeth and Oropharynx as per pre-operative assessment

## 2020-04-18 NOTE — Progress Notes (Signed)
Pt. Came out to PACU on Cleviprex at 6 mg/hr with systolics in [id number].  Dr. Carlis Abbott wanted to transfer to [facility] so we cut off cleviprex and used PRNs to control blood pressure. Pt. Came into ED with systolic in the [id number].  See MAR for meds given. Both Dr. Carlis Abbott and Dr. Lanetta Inch were good with pt systolic less than [id number]. Pt. Has been in the 160s/ 170s for the last 30 min and Dr. Lanetta Inch ok with patient going to [address] with floor PRNs for HTN.

## 2020-04-18 NOTE — Plan of Care (Signed)

## 2020-04-18 NOTE — Progress Notes (Signed)
Samuel George  [id number] DOB: Jul 01, 1951 DOA: 04/14/2020 PCP: London Pepper, MD    Brief Narrative:  69 year old with a history of HTN who presented to the ED with the acute onset of slurred speech and right facial droop on 5/16.  His wife called EMS who found his blood pressure to be 220/140.  In the ED CTa head and neck revealed a critical proximal left ICA stenosis with string sign and age-indeterminate watershed infarction at the left cerebral white matter.  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  Neurology was consulted.  The patient was started on aspirin 325 mg daily, high intensity statin, and permissive hypertension was practiced.  Significant Events: 5/16 admit via ED 5/17 Vascular Surgery consulted 5/20 for TCAR  Antimicrobials:  None  Subjective: The patient has been under the care of the vascular surgery service/in the OR today.  Chart and data reviewed with patient otherwise medically stable at this time.  Assessment & Plan:  Acute punctate ischemia of left frontal lobe, parietal lobe, and precentral gyrus with critical stenosis of left ICA Symptoms now resolved -CTa head and neck noted critical stenosis of left ICA -MRI brain with acute punctate ischemia of left frontal lobe, parietal lobe, and precentral gyrus with multiple old corona radiata small vessel infarcts -presently being treated with Plavix plus full dose aspirin -Lipitor started due to LDL 109 -no acute findings on TTE -bilateral carotid US with 80-99% left ICA stenosis and 1-39% right ICA stenosis - s/p TCAR 5/20  HTN Practicing permissive hypertension for now - BP stable  Asymptomatic E. coli UTI Following without antibiotic therapy for now  CKD stage III Creatinine stable at approximate 1.5  Newly diagnosed prediabetes A1c 5.8 -patient to be educated -will need  outpatient follow-up  Chronic diastolic CHF Grade 1 DD with EF 65% per TTE this admit    DVT prophylaxis: Lovenox Code Status: FULL CODE Family Communication:  Status is: Inpatient  Remains inpatient appropriate because:Ongoing diagnostic testing needed not appropriate for outpatient work up   Dispo: The patient is from: Home              Anticipated d/c is to: Home              Anticipated d/c date is: 2 days              Patient currently is not medically stable to d/c.  Consultants:  Stroke service Vascular surgery  Objective: Blood pressure (!) 147/66, pulse 60, temperature 97.8 F (36.6 C), temperature source Oral, resp. rate 17, height 5\' 11"  (1.803 m), weight 98.4 kg, SpO2 99 %.  Intake/Output Summary (Last 24 hours) at 04/18/2020 0713 Last data filed at 04/17/2020 1300 Gross per 24 hour  Intake 560 ml  Output --  Net 560 ml   Filed Weights   04/14/20 1828  Weight: 98.4 kg    Examination:   CBC: Recent Labs  Lab 04/14/20 1938 04/14/20 1938 04/14/20 2003 04/16/20 0246 04/18/20 0233  WBC 4.5  --   --  5.6 5.2  NEUTROABS 2.8  --   --   --   --   HGB 14.2   < > 14.3 13.4 12.7*  HCT 43.5   < > 42.0 39.8 38.5*  MCV 96.5  --   --  94.5 95.8  PLT 209  --   --  233 232   < > = values in this interval not displayed.   Basic Metabolic Panel: Recent Labs  Lab 04/14/20 1938 04/14/20 1938 04/14/20 2003 04/16/20 0246 04/18/20 0233  NA 136   < > 137 139 142  K 4.4   < > 4.0 3.7 4.2  CL 105   < > 107 107 109  CO2 20*  --   --  21* 24  GLUCOSE 93   < > 90 102* 96  BUN 30*   < > 31* 28* 28*  CREATININE 1.51*   < > 1.50* 1.55* 1.46*  CALCIUM 9.0  --   --  8.8* 8.9   < > = values in this interval not displayed.   GFR: Estimated Creatinine Clearance: 57.1 mL/min (A) (by C-G formula based on SCr of 1.46 mg/dL (H)).  Liver Function Tests: Recent Labs  Lab 04/14/20 1938  AST 20  ALT 25  ALKPHOS 50  BILITOT 0.4  PROT 7.7  ALBUMIN 4.0     Coagulation Profile: Recent Labs  Lab 04/14/20 1938 04/14/20 2040  INR SPECIMEN CLOTTED 1.0    HbA1C: Hgb A1c MFr Bld  Date/Time Value Ref Range Status  04/15/2020 03:11 AM 5.8 (H) 4.8 - 5.6 % Final    Comment:    (NOTE)         Prediabetes: 5.7 - 6.4         Diabetes: >6.4         Glycemic control for adults with diabetes: <7.0      Recent Results (from the past 240 hour(s))  SARS Coronavirus 2 by RT PCR (hospital order, performed in [place] hospital lab) Nasopharyngeal Nasopharyngeal Swab     Status: None   Collection Time: 04/14/20  9:58 PM   Specimen: Nasopharyngeal Swab  Result Value Ref Range Status   SARS Coronavirus 2 NEGATIVE NEGATIVE Final    Comment: (NOTE) SARS-CoV-2 target nucleic acids are NOT DETECTED. The SARS-CoV-2 RNA is generally detectable in upper and lower respiratory specimens during the acute phase of infection. The lowest concentration of SARS-CoV-2 viral copies this assay can detect is 250 copies / mL. A negative result does not preclude SARS-CoV-2 infection and should not be used as the sole basis for treatment or other patient management decisions.  A negative result may occur with improper specimen collection / handling, submission of specimen other than nasopharyngeal swab, presence of viral mutation(s) within the areas targeted by this assay, and inadequate number of viral copies (<250 copies / mL). A negative result must be combined with clinical observations, patient history, and epidemiological information. Fact Sheet for Patients:   StrictlyIdeas.no Fact Sheet for Healthcare Providers: BankingDealers.co.za This test is not yet approved or cleared  by the [place] FDA and has been authorized for detection and/or diagnosis of SARS-CoV-2 by FDA under an Emergency Use Authorization (EUA).  This EUA will remain in effect (meaning this test can be used) for the duration of  the COVID-19 declaration under Section 564(b)(1) of the Act, 21 U.S.C. section 360bbb-3(b)(1), unless the authorization is terminated or revoked sooner. Performed at [facility], [place] [address] [place], [place] [address]   Culture, Urine     Status: Abnormal   Collection Time: 04/15/20  2:55 AM   Specimen: Urine, Clean Catch  Result Value Ref Range Status   Specimen Description URINE, CLEAN CATCH  Final   Special Requests   Final    NONE Performed at [facility]  Lab, [address] [place], [place] [address]    Culture >=100,000 COLONIES/mL ESCHERICHIA COLI (A)  Final   Report Status 04/16/2020 FINAL  Final   Organism ID, Bacteria ESCHERICHIA COLI (A)  Final      Susceptibility   Escherichia coli - MIC*    AMPICILLIN <=2 SENSITIVE Sensitive     CEFAZOLIN <=4 SENSITIVE Sensitive     CEFTRIAXONE <=1 SENSITIVE Sensitive     CIPROFLOXACIN <=0.25 SENSITIVE Sensitive     GENTAMICIN <=1 SENSITIVE Sensitive     IMIPENEM <=0.25 SENSITIVE Sensitive     NITROFURANTOIN <=16 SENSITIVE Sensitive     TRIMETH/SULFA <=20 SENSITIVE Sensitive     AMPICILLIN/SULBACTAM <=2 SENSITIVE Sensitive     PIP/TAZO <=4 SENSITIVE Sensitive     * >=100,000 COLONIES/mL ESCHERICHIA COLI     Scheduled Meds: . aspirin EC  325 mg Oral Daily  . atorvastatin  40 mg Oral Daily  . clopidogrel  75 mg Oral Daily  . enoxaparin (LOVENOX) injection  40 mg Subcutaneous Q24H  . multivitamin with minerals  1 tablet Oral Daily     LOS: 4 days   Cherene Altes, MD [facility] Office  [phone] Pager - Text Page per Shea Evans  If 7PM-7AM, please contact night-coverage per Amion 04/18/2020, 7:13 AM

## 2020-04-18 NOTE — Progress Notes (Signed)
Patient to room 4E27 from PACU. Vital signs obtained. On monitor CCMD notified. CHG bath completed. Alert and oriented to room and call light. Call bell within reach.  Paulene Floor, RN

## 2020-04-18 NOTE — Anesthesia Procedure Notes (Signed)
Arterial Line Insertion Start/End5/20/2021 11:40 AM, 04/18/2020 11:55 AM Performed by: Verdie Drown, CRNA, CRNA  Patient location: Pre-op. Preanesthetic checklist: patient identified, IV checked, site marked, risks and benefits discussed, surgical consent, monitors and equipment checked, pre-op evaluation, timeout performed and anesthesia consent Lidocaine 1% used for infiltration Left, radial was placed Catheter size: 20 G Hand hygiene performed , maximum sterile barriers used  and Seldinger technique used  Attempts: 2 Procedure performed without using ultrasound guided technique. Following insertion, dressing applied. Post procedure assessment: normal  Patient tolerated the procedure well with no immediate complications.

## 2020-04-18 NOTE — Anesthesia Postprocedure Evaluation (Signed)
Anesthesia Post Note  Patient: Samuel George  Procedure(s) Performed: TRANSCAROTID ARTERY REVASCULARIZATION LEFT (Left )     Patient location during evaluation: PACU Anesthesia Type: General Level of consciousness: awake and alert Pain management: pain level controlled Vital Signs Assessment: post-procedure vital signs reviewed and stable Respiratory status: spontaneous breathing, nonlabored ventilation, respiratory function stable and patient connected to nasal cannula oxygen Cardiovascular status: blood pressure returned to baseline and stable Postop Assessment: no apparent nausea or vomiting Anesthetic complications: no    Last Vitals:  Vitals:   04/18/20 1730 04/18/20 1745  BP: (!) 123/45 (!) 122/43  Pulse: 83 83  Resp: 14 17  Temp:    SpO2: 94% 96%    Last Pain:  Vitals:   04/18/20 1745  TempSrc:   PainSc: 0-No pain                 Omarian Jaquith L Shailynn Fong

## 2020-04-19 LAB — CBC
HCT: 35.1 % — ABNORMAL LOW (ref 39.0–52.0)
Hemoglobin: 11.6 g/dL — ABNORMAL LOW (ref 13.0–17.0)
MCH: 31.3 pg (ref 26.0–34.0)
MCHC: 33 g/dL (ref 30.0–36.0)
MCV: 94.6 fL (ref 80.0–100.0)
Platelets: 233 10*3/uL (ref 150–400)
RBC: 3.71 MIL/uL — ABNORMAL LOW (ref 4.22–5.81)
RDW: 12.1 % (ref 11.5–15.5)
WBC: 9.2 10*3/uL (ref 4.0–10.5)
nRBC: 0 % (ref 0.0–0.2)

## 2020-04-19 LAB — BASIC METABOLIC PANEL
Anion gap: 11 (ref 5–15)
BUN: 23 mg/dL (ref 8–23)
CO2: 21 mmol/L — ABNORMAL LOW (ref 22–32)
Calcium: 8.3 mg/dL — ABNORMAL LOW (ref 8.9–10.3)
Chloride: 109 mmol/L (ref 98–111)
Creatinine, Ser: 1.35 mg/dL — ABNORMAL HIGH (ref 0.61–1.24)
GFR calc Af Amer: >60 mL/min (ref >60)
GFR calc non Af Amer: 53 mL/min — ABNORMAL LOW (ref >60)
Glucose, Bld: 114 mg/dL — ABNORMAL HIGH (ref 70–99)
Potassium: 3.7 mmol/L (ref 3.5–5.1)
Sodium: 141 mmol/L (ref 135–145)

## 2020-04-19 MED ORDER — ASPIRIN 325 MG PO TBEC: 325.0000 mg | DELAYED_RELEASE_TABLET | Freq: Every day | ORAL | 0 refills | Status: DC

## 2020-04-19 MED ORDER — CLOPIDOGREL BISULFATE 75 MG PO TABS: 75.0000 mg | ORAL_TABLET | Freq: Every day | ORAL | 1 refills | Status: DC

## 2020-04-19 MED ORDER — ATORVASTATIN CALCIUM 40 MG PO TABS: 40.0000 mg | ORAL_TABLET | Freq: Every day | ORAL | 1 refills | Status: DC

## 2020-04-19 NOTE — Progress Notes (Signed)
Pt discharged today to home with wife.  Pt's IV's removed.  Pt taken of telemetry and CCMD notified.  Pt left with all of their personal belongings.  AVS documentation reviewed with Pt and all questions answered.

## 2020-04-19 NOTE — Discharge Summary (Signed)
DISCHARGE SUMMARY  Samuel George  MR#: [id number]  DOB:Dec 16, 1950  Date of Admission: 04/14/2020 Date of Discharge: 04/19/2020  Attending Physician:Kariyah Baugh Hennie Duos, MD  Patient's [id number], Samuel Lies, MD  Consults:  Stroke Team Vasc Surgery   Disposition: D/C home    Follow-up Appts: Follow-up Information    [facility]. Schedule an appointment as soon as possible for a visit in 4 week(s).   Specialty: Neurology Contact information: [address] [place] [phone]       Marty Heck, MD Follow up in 1 month(s).   Specialty: Vascular Surgery Why: 1 month. office will contact patient with follow up appointment Contact information: [place] [address] [phone]        London Pepper, MD Follow up in 1 week(s).   Specialty: Family Medicine Contact information: [place] [address] [phone]           Tests Needing Follow-up: -assess BP control and determine if resumption of bystolic or avalide is indicated - goal is normotension  -assess for tolerance of newly initiated Lipitor  -recheck renal fxn -assess prediabetes and watch for progression to true DM -assess extent of EtOH use/potential for abuse   Discharge Diagnoses: Acute punctate ischemia of left frontal lobe, parietal lobe, and precentral gyrus with critical stenosis of left ICA HTN Asymptomatic E. coli UTI CKD stage III Newly diagnosed prediabetes Chronic diastolic CHF  Initial presentation: 69 year old with a history of HTN who presented to the ED with the acute onset of slurred speech and right facial droop on 5/16.  His wife called EMS who found his blood pressure to be 220/140.  In the ED CTa head and neck revealed a critical proximal left ICA stenosis with string sign and age-indeterminate watershed infarction at the left cerebral white matter.  MRI showed punctate foci  of acute ischemia in the inferior left frontal lobe and left parietal lobe and precentral gyrus with multiple old corona radiata small vessel infarcts.  Neurology was consulted.  The patient was started on aspirin 325 mg daily, high intensity statin, and permissive hypertension was practiced.  Hospital Course:  Acute punctate ischemia of left frontal lobe, parietal lobe, and precentral gyrus with critical stenosis of left ICA Symptoms resolved shortly after presentation -CTa head and neck noted critical stenosis of left ICA - MRI brain with acute punctate ischemia of left frontal lobe, parietal lobe, and precentral gyrus with multiple old corona radiata small vessel infarcts - presently being treated with Plavix plus full dose aspirin - Lipitor started due to LDL 109 - no acute findings on TTE - bilateral carotid US with 80-99% left ICA stenosis and 1-39% right ICA stenosis - s/p TCAR 5/20 w/ postop care per Vasc Surgery   HTN BP stable at time of d/c - avalide, aldactone, and bystolic not dosed during this hospital stay - resume norvasc at time of d/c - permissive HTN observed until post TCAR, w/ goal now being normotension - pt will likely require stepwise resumption of prior BP meds in outpt f/u   Asymptomatic E. coli UTI Followed without antibiotic therapy   CKD stage III Creatinine stable at approximately 1.5  Newly diagnosed prediabetes A1c 5.8 - will need outpatient follow-up and monitoring   Chronic diastolic CHF Grade 1 DD with EF 65% per TTE this admit   Allergies as of 04/19/2020   No Known Allergies     Medication List    STOP  taking these medications   Bystolic 20 MG Tabs Generic drug: Nebivolol HCl   irbesartan-hydrochlorothiazide 300-12.5 MG tablet Commonly known as: AVALIDE   spironolactone 25 MG tablet Commonly known as: ALDACTONE     TAKE these medications   amLODipine 10 MG tablet Commonly known as: NORVASC Take 1 tablet (10 mg total) by mouth daily.    aspirin 325 MG EC tablet Take 1 tablet (325 mg total) by mouth daily. Start taking on: Apr 20, 2020 What changed:   medication strength  how much to take   atorvastatin 40 MG tablet Commonly known as: LIPITOR Take 1 tablet (40 mg total) by mouth daily. Start taking on: Apr 20, 2020   clopidogrel 75 MG tablet Commonly known as: PLAVIX Take 1 tablet (75 mg total) by mouth daily. Start taking on: Apr 20, 2020   multivitamin with minerals Tabs tablet Take 1 tablet by mouth daily.       Day of Discharge BP 135/70 (BP Location: Right Arm)   Pulse 65   Temp 98.6 F (37 C) (Oral)   Resp 18   Ht 5\' 11"  (1.803 m)   Wt 98.4 kg   SpO2 96%   BMI 30.27 kg/m   Physical Exam: General: No acute respiratory distress Lungs: Clear to auscultation bilaterally without wheezes or crackles Cardiovascular: Regular rate and rhythm without murmur gallop or rub normal S1 and S2 Abdomen: Nontender, nondistended, soft, bowel sounds positive, no rebound, no ascites, no appreciable mass Extremities: No significant cyanosis, clubbing, or edema bilateral lower extremities  Basic Metabolic Panel: Recent Labs  Lab 04/14/20 1938 04/14/20 2003 04/16/20 0246 04/18/20 0233 04/19/20 0455  NA 136 137 139 142 141  K 4.4 4.0 3.7 4.2 3.7  CL 105 107 107 109 109  CO2 20*  --  21* 24 21*  GLUCOSE 93 90 102* 96 114*  BUN 30* 31* 28* 28* 23  CREATININE 1.51* 1.50* 1.55* 1.46* 1.35*  CALCIUM 9.0  --  8.8* 8.9 8.3*    Liver Function Tests: Recent Labs  Lab 04/14/20 1938  AST 20  ALT 25  ALKPHOS 50  BILITOT 0.4  PROT 7.7  ALBUMIN 4.0   Coags: Recent Labs  Lab 04/14/20 1938 04/14/20 2040  INR SPECIMEN CLOTTED 1.0   CBC: Recent Labs  Lab 04/14/20 1938 04/14/20 2003 04/16/20 0246 04/18/20 0233 04/19/20 0455  WBC 4.5  --  5.6 5.2 9.2  NEUTROABS 2.8  --   --   --   --   HGB 14.2 14.3 13.4 12.7* 11.6*  HCT 43.5 42.0 39.8 38.5* 35.1*  MCV 96.5  --  94.5 95.8 94.6  PLT 209  --  233  232 233    Recent Results (from the past 240 hour(s))  SARS Coronavirus 2 by RT PCR (hospital order, performed in [place] hospital lab) Nasopharyngeal Nasopharyngeal Swab     Status: None   Collection Time: 04/14/20  9:58 PM   Specimen: Nasopharyngeal Swab  Result Value Ref Range Status   SARS Coronavirus 2 NEGATIVE NEGATIVE Final    Comment: (NOTE) SARS-CoV-2 target nucleic acids are NOT DETECTED. The SARS-CoV-2 RNA is generally detectable in upper and lower respiratory specimens during the acute phase of infection. The lowest concentration of SARS-CoV-2 viral copies this assay can detect is 250 copies / mL. A negative result does not preclude SARS-CoV-2 infection and should not be used as the sole basis for treatment or other patient management decisions.  A negative result may occur with  improper specimen collection / handling, submission of specimen other than nasopharyngeal swab, presence of viral mutation(s) within the areas targeted by this assay, and inadequate number of viral copies (<250 copies / mL). A negative result must be combined with clinical observations, patient history, and epidemiological information. Fact Sheet for Patients:   StrictlyIdeas.no Fact Sheet for Healthcare Providers: BankingDealers.co.za This test is not yet approved or cleared  by the [place] FDA and has been authorized for detection and/or diagnosis of SARS-CoV-2 by FDA under an Emergency Use Authorization (EUA).  This EUA will remain in effect (meaning this test can be used) for the duration of the COVID-19 declaration under Section 564(b)(1) of the Act, 21 U.S.C. section 360bbb-3(b)(1), unless the authorization is terminated or revoked sooner. Performed at [facility], [place] [address] [place], [place] [address]   Culture, Urine     Status: Abnormal   Collection Time: 04/15/20  2:55 AM   Specimen: Urine, Clean Catch  Result  Value Ref Range Status   Specimen Description URINE, CLEAN CATCH  Final   Special Requests   Final    NONE Performed at [facility], [place] [address], [place], [place] [address]    Culture >=100,000 COLONIES/mL ESCHERICHIA COLI (A)  Final   Report Status 04/16/2020 FINAL  Final   Organism ID, Bacteria ESCHERICHIA COLI (A)  Final      Susceptibility   Escherichia coli - MIC*    AMPICILLIN <=2 SENSITIVE Sensitive     CEFAZOLIN <=4 SENSITIVE Sensitive     CEFTRIAXONE <=1 SENSITIVE Sensitive     CIPROFLOXACIN <=0.25 SENSITIVE Sensitive     GENTAMICIN <=1 SENSITIVE Sensitive     IMIPENEM <=0.25 SENSITIVE Sensitive     NITROFURANTOIN <=16 SENSITIVE Sensitive     TRIMETH/SULFA <=20 SENSITIVE Sensitive     AMPICILLIN/SULBACTAM <=2 SENSITIVE Sensitive     PIP/TAZO <=4 SENSITIVE Sensitive     * >=100,000 COLONIES/mL ESCHERICHIA COLI      Time spent in discharge (includes decision making & examination of pt): 35 minutes  04/19/2020, 10:45 AM   Cherene Altes, MD [facility] Office  [phone]

## 2020-04-19 NOTE — Discharge Instructions (Signed)
 Hospital Discharge After a Stroke  Being discharged from the hospital after a stroke can feel overwhelming. Many things may be different, and it is normal to feel scared or anxious. Some stroke survivors may be able to return to their homes, and others may need more specialized care on a temporary or permanent basis. Your stroke care team will work with you to develop a discharge plan that is best for you. Ask questions if you do not understand something. Invite a friend or family member to participate in discharge planning. Understanding and following your discharge plan can help to prevent another stroke or other problems. Understanding your medicines After a stroke, your health care provider may prescribe one or more types of medicine. It is important to take medicines exactly as told by your health care provider. Serious harm, such as another stroke, can happen if you are unable to take your medicine exactly as prescribed. Make sure you understand:  What medicine to take.  Why you are taking the medicine.  How and when to take it.  If it can be taken with your other medicines and herbal supplements.  Possible side effects.  When to call your health care provider if you have any side effects.  How you will get and pay for your medicines. Medical assistance programs may be able to help you pay for prescription medicines if you cannot afford them. If you are taking an anticoagulant, be sure to take it exactly as told by your health care provider. This type of medicine can increase the risk of bleeding because it works to prevent blood from clotting. You may need to take certain precautions to prevent bleeding. You should contact your health care provider if you have:  Bleeding or bruising.  A fall or other injury to your head.  Blood in your urine or stool (feces). Planning for home safety  Take steps to prevent falls, such as installing grab bars or using a shower chair. Ask a  friend or family member to get needed things in place before you go home if possible. A therapist can come to your home to make recommendations for safety equipment. Ask your health care provider if you would benefit from this service or from home care. Getting needed equipment Ask your health care provider for a list of any medical equipment and supplies you will need at home. These may include items such as:  Walkers.  Canes.  Wheelchairs.  Hand-strengthening devices.  Special eating utensils. Medical equipment can be rented or purchased, depending on your insurance coverage. Check with your insurance company about what is covered. Keeping follow-up visits After a stroke, you will need to follow up regularly with a health care provider. You may also need rehabilitation, which can include physical therapy, occupational therapy, or speech-language therapy. Keeping these appointments is very important to your recovery after a stroke. Be sure to bring your medicine list and discharge papers with you to your appointments. If you need help to keep track of your schedule, use a calendar or appointment reminder. Preventing another stroke Having a stroke puts you at risk for another stroke in the future. Ask your health care provider what actions you can take to lower the risk. These may include:  Increasing how much you exercise.  Making a healthy eating plan.  Quitting smoking.  Managing other health conditions, such as high blood pressure, high cholesterol, or diabetes.  Limiting alcohol use. Knowing the warning signs of a stroke  Make   sure you understand the signs of a stroke. Before you leave the hospital, you will receive information outlining the stroke warning signs. Share these with your friends and family members. "BE FAST" is an easy way to remember the main warning signs of a stroke:  B - Balance. Signs are dizziness, sudden trouble walking, or loss of balance.  E - Eyes.  Signs are trouble seeing or a sudden change in vision.  F - Face. Signs are sudden weakness or numbness of the face, or the face or eyelid drooping on one side.  A - Arms. Signs are weakness or numbness in an arm. This happens suddenly and usually on one side of the body.  S - Speech. Signs are sudden trouble speaking, slurred speech, or trouble understanding what people say.  T - Time. Time to call emergency services. Write down what time symptoms started. Other signs of stroke may include:  A sudden, severe headache with no known cause.  Nausea or vomiting.  Seizure. These symptoms may represent a serious problem that is an emergency. Do not wait to see if the symptoms will go away. Get medical help right away. Call your local emergency services (911 in the [place] Do not drive yourself to the hospital. Make note of the time that you had your first symptoms. Your emergency responders or emergency room staff will need to know this information. Summary  Being discharged from the hospital after a stroke can feel overwhelming. It is normal to feel scared or anxious.  Make sure you take medicines exactly as told by your health care provider.  Know the warning signs of a stroke, and get help right way if you have any of these symptoms. "BE FAST" is an easy way to remember the main warning signs of a stroke. This information is not intended to replace advice given to you by your health care provider. Make sure you discuss any questions you have with your health care provider. Document Revised: 08/09/2019 Document Reviewed: 02/19/2017 Elsevier Patient Education  2020 Elsevier Inc.  

## 2020-04-19 NOTE — Care Management (Signed)
Consult written to see patient regarding substance abuse alcohol. Patient states he drinks two beers a day. He feels like there is no problem, he does not consume more than this. Spoke about how alcohol can affect the body and increased consumption can signal a problem. AA meetings are online due to [place] I put the website in his discharge instructions. He thanked me for speaking to him about it and was open to the conversation and education.

## 2020-04-19 NOTE — Progress Notes (Signed)
[facility]  Subjective  -no complaints this morning.  Neurologically intact overnight.   Objective 136/66 71 98.2 F (36.8 C) (Oral) 18 97%  Intake/Output Summary (Last 24 hours) at 04/19/2020 0751 Last data filed at 04/19/2020 0510 Gross per 24 hour  Intake 1185 ml  Output 750 ml  Net 435 ml    Left neck incision clean dry and intact above clavicle no hematoma Right groin incision clean dry and intact Cranial nerves II through XII grossly intact and no deficits on exam  Laboratory Lab Results: Recent Labs    04/18/20 0233 04/19/20 0455  WBC 5.2 9.2  HGB 12.7* 11.6*  HCT 38.5* 35.1*  PLT 232 233   BMET Recent Labs    04/18/20 0233 04/19/20 0455  NA 142 141  K 4.2 3.7  CL 109 109  CO2 24 21*  GLUCOSE 96 114*  BUN 28* 23  CREATININE 1.46* 1.35*  CALCIUM 8.9 8.3*    COAG Lab Results  Component Value Date   INR 1.0 04/14/2020   INR SPECIMEN CLOTTED 04/14/2020   No results found for: PTT  Assessment/Planning:  Postop day 1 status post left TCAR for symptomatic high-grade left ICA stenosis with string sign and high lesion.  He is neuro intact and has done very well postoperatively.  He can be discharged from my standpoint.  We will arrange follow-up in 1 month with carotid duplex in our office.  He will need Plavix and statin prescription at discharge and will also need to take aspirin over-the-counter which I instructed him and his wife.  Call with questions or concerns.   Marty Heck 04/19/2020 7:51 AM --

## 2020-04-20 LAB — POCT ACTIVATED CLOTTING TIME: Activated Clotting Time: 246 seconds

## 2020-04-23 ENCOUNTER — Encounter: Payer: Self-pay | Admitting: Cardiovascular Disease

## 2020-04-23 ENCOUNTER — Ambulatory Visit: Payer: Medicare Other | Admitting: Cardiovascular Disease

## 2020-04-23 ENCOUNTER — Other Ambulatory Visit: Payer: Self-pay

## 2020-04-23 VITALS — BP 148/86 | HR 74 | Ht 71.0 in | Wt 226.0 lb

## 2020-04-23 DIAGNOSIS — I701 Atherosclerosis of renal artery: Secondary | ICD-10-CM

## 2020-04-23 DIAGNOSIS — I639 Cerebral infarction, unspecified: Secondary | ICD-10-CM

## 2020-04-23 DIAGNOSIS — E785 Hyperlipidemia, unspecified: Secondary | ICD-10-CM | POA: Diagnosis not present

## 2020-04-23 NOTE — Progress Notes (Signed)
Cardiology Office Note   Date:  04/23/2020   ID:  Samuel George, DOB 05-15-1951, MRN [id number]  PCP:  Samuel Pepper, MD  Cardiologist: Dr. Oval Linsey and [name]  No chief complaint on file.     History of Present Illness: Samuel George is a 69 y.o. male who was referred by Dr. Oval Linsey for evaluation of renal artery stenosis.  He has known history of hypertension and PVCs.  He was diagnosed with hypertension in December of last year and her blood pressure has been difficult to control since then.  There was some concern about underlying hyperaldosteronism.  He had intolerance to some antihypertensive medication. The patient was hospitalized last week [facility] with acute punctate ischemia of the left frontal lobe, parietal lobe with critical stenosis of the left ICA.  He underwent TCAR during his hospitalization.  His antihypertensive medications were decreased.[name]  Plavix and atorvastatin were added.  He had renal artery duplex in April which showed mild nonobstructive bilateral renal artery stenosis less than 60% with some changes suggestive of mild fibromuscular dysplasia.   Since hospital discharge, he has been doing extremely well with no chest pain, shortness of breath or palpitations.  Even though most of his antihypertensive medications were stopped and he was only discharged on amlodipine 10 mg daily, his blood pressure actually has been much better than before.  He has been able to exercise on the treadmill with no significant limitations.  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  He does not smoke.  Past Medical History:  Diagnosis Date  . Hypertension     Past Surgical History:  Procedure Laterality Date  . TRANSCAROTID ARTERY REVASCULARIZATION Left 04/18/2020   Procedure: TRANSCAROTID ARTERY REVASCULARIZATION LEFT;  Surgeon: Marty Heck, MD;  Location: [facility];  Service: Vascular;  Laterality:  Left;     Current Outpatient Medications  Medication Sig Dispense Refill  . amLODipine (NORVASC) 10 MG tablet Take 1 tablet (10 mg total) by mouth daily. 90 tablet 3  . aspirin EC 325 MG EC tablet Take 1 tablet (325 mg total) by mouth daily. 30 tablet 0  . atorvastatin (LIPITOR) 40 MG tablet Take 1 tablet (40 mg total) by mouth daily. 30 tablet 1  . clopidogrel (PLAVIX) 75 MG tablet Take 1 tablet (75 mg total) by mouth daily. 30 tablet 1  . Multiple Vitamin (MULTIVITAMIN WITH MINERALS) TABS tablet Take 1 tablet by mouth daily.     No current facility-administered medications for this visit.    Allergies:   Patient has no known allergies.    Social History:  The patient  reports that he has never smoked. He has never used smokeless tobacco.   Family History:  The patient's family history includes Hypertension in his mother.    ROS:  Please see the history of present illness.   Otherwise, review of systems are positive for none.   All other systems are reviewed and negative.    PHYSICAL EXAM: VS:  BP (!) 148/86   Pulse 74   Ht 5\' 11"  (1.803 m)   Wt 226 lb (102.5 kg)   SpO2 98%   BMI 31.52 kg/m  , BMI Body mass index is 31.52 kg/m. GEN: Well nourished, well developed, in no acute distress  HEENT: normal  Neck: no JVD, carotid bruits, or masses Cardiac: RRR; no murmurs, rubs, or gallops,no edema  Respiratory:  clear to auscultation bilaterally, normal  work of breathing GI: soft, nontender, nondistended, + BS MS: no deformity or atrophy  Skin: warm and dry, no rash Neuro:  Strength and sensation are intact Psych: euthymic mood, full affect   EKG:  EKG is not ordered today.    Recent Labs: 04/14/2020: ALT 25 04/19/2020: BUN 23; Creatinine, Ser 1.35; Hemoglobin 11.6; Platelets 233; Potassium 3.7; Sodium 141    Lipid Panel    Component Value Date/Time   CHOL 186 04/15/2020 0311   TRIG 47 04/15/2020 0311   HDL 68 04/15/2020 0311   CHOLHDL 2.7 04/15/2020 0311   VLDL  9 04/15/2020 0311   LDLCALC 109 (H) 04/15/2020 0311      Wt Readings from Last 3 Encounters:  04/23/20 226 lb (102.5 kg)  04/18/20 217 lb (98.4 kg)  03/19/20 227 lb (103 kg)       No flowsheet data found.    ASSESSMENT AND PLAN:  1.  Essential hypertension with possible underlying renal artery stenosis: The patient was diagnosed with severe hypertension late last year which raise the possibility of secondary hypertension.  He was hospitalized recently with stroke and was found to have critical left carotid artery stenosis.  He underwent successful TCAR.  Surprisingly, since that time, his blood pressure has improved and he is currently only on amlodipine. I reviewed his renal artery duplex which overall showed no critical stenosis in the renal arteries. I would only recommend renal artery angiography if his blood pressure becomes uncontrolled again in spite of 3 antihypertensive medications. For now, the plan is to allow his blood pressure to be on the high side for at least another month and then add additional medications as needed. I will have him follow-up with me in 3 months to reevaluate.  2.  Recent stroke: Likely due to critical left ICA stenosis which was revascularized.  3.  Hyperlipidemia: Atorvastatin was added and the patient will require lipid profile in 1 to 2 months.    Disposition:   FU with me in 3 months  Signed,  Kathlyn Sacramento, MD  04/23/2020 9:36 AM    [name]

## 2020-04-23 NOTE — Patient Instructions (Signed)
Medication Instructions:  No changes *If you need a refill on your cardiac medications before your next appointment, please call your pharmacy*   Lab Work: None ordered If you have labs (blood work) drawn today and your tests are completely normal, you will receive your results only by: MyChart Message (if you have MyChart) OR A paper copy in the mail If you have any lab test that is abnormal or we need to change your treatment, we will call you to review the results.   Testing/Procedures: None ordered   Follow-Up: At [facility], you and your health needs are our priority.  As part of our continuing mission to provide you with exceptional heart care, we have created designated Provider Care Teams.  These Care Teams include your primary Cardiologist (physician) and Advanced Practice Providers (APPs -  Physician Assistants and Nurse Practitioners) who all work together to provide you with the care you need, when you need it.  We recommend signing up for the patient portal called "MyChart".  Sign up information is provided on this After Visit Summary.  MyChart is used to connect with patients for Virtual Visits (Telemedicine).  Patients are able to view lab/test results, encounter notes, upcoming appointments, etc.  Non-urgent messages can be sent to your provider as well.   To learn more about what you can do with MyChart, go to [email or web address].    Your next appointment:   3 month(s)  The format for your next appointment:   In Person  Provider:   Muhammad Arida, MD   

## 2020-05-03 ENCOUNTER — Telehealth: Payer: Self-pay | Admitting: *Deleted

## 2020-05-03 MED ORDER — IRBESARTAN-HYDROCHLOROTHIAZIDE 300-12.5 MG PO TABS
1.0000 | ORAL_TABLET | Freq: Every day | ORAL | 1 refills | Status: DC
Start: 2020-05-03 — End: 2020-05-21

## 2020-05-03 NOTE — Telephone Encounter (Signed)
-----   Message from Skeet Latch, MD sent at 05/02/2020  4:44 PM EDT ----- Regarding: BP BP running high since many meds were stopped in the hospital.  Please let him know to restart irbesartan/hctz 300/25mg , which was his previous dose.  Keep tracking BP in [facility].  This is very helpful!

## 2020-05-03 NOTE — Telephone Encounter (Signed)
Left message to call back  

## 2020-05-03 NOTE — Telephone Encounter (Signed)
Advised patient, verbalized understanding  Patient was previously taking Irbesartan hct 300-12.5 mg daily,  Rx sent over for that dose

## 2020-05-03 NOTE — Telephone Encounter (Signed)
Follow Up  Patient was returning phone call

## 2020-05-08 NOTE — Telephone Encounter (Signed)
OK thank you 

## 2020-05-14 ENCOUNTER — Other Ambulatory Visit: Payer: Self-pay | Admitting: *Deleted

## 2020-05-14 DIAGNOSIS — I6522 Occlusion and stenosis of left carotid artery: Secondary | ICD-10-CM

## 2020-05-21 ENCOUNTER — Ambulatory Visit (INDEPENDENT_AMBULATORY_CARE_PROVIDER_SITE_OTHER): Payer: Self-pay | Admitting: Vascular Surgery

## 2020-05-21 ENCOUNTER — Other Ambulatory Visit: Payer: Self-pay

## 2020-05-21 ENCOUNTER — Encounter: Payer: Self-pay | Admitting: Adult Health

## 2020-05-21 ENCOUNTER — Telehealth: Payer: Self-pay

## 2020-05-21 ENCOUNTER — Ambulatory Visit: Payer: Medicare Other | Admitting: Adult Health

## 2020-05-21 ENCOUNTER — Ambulatory Visit: Payer: Medicare Other | Admitting: Vascular Surgery

## 2020-05-21 ENCOUNTER — Ambulatory Visit (HOSPITAL_COMMUNITY): Payer: Medicare Other

## 2020-05-21 ENCOUNTER — Encounter: Payer: Self-pay | Admitting: Vascular Surgery

## 2020-05-21 ENCOUNTER — Ambulatory Visit (HOSPITAL_COMMUNITY)
Admission: RE | Admit: 2020-05-21 | Discharge: 2020-05-21 | Disposition: A | Discharge status: Home / Self Care | Payer: Medicare Other | Source: Ambulatory Visit | Attending: Vascular Surgery | Admitting: Vascular Surgery

## 2020-05-21 ENCOUNTER — Ambulatory Visit (INDEPENDENT_AMBULATORY_CARE_PROVIDER_SITE_OTHER): Payer: Medicare Other | Admitting: Pharmacist Clinician (PhC)/ Clinical Pharmacy Specialist

## 2020-05-21 VITALS — BP 164/70 | HR 69 | Temp 97.3°F | Resp 18 | Ht 71.0 in | Wt 218.0 lb

## 2020-05-21 VITALS — BP 145/63 | HR 72 | Ht 71.0 in | Wt 220.0 lb

## 2020-05-21 VITALS — BP 130/62 | HR 52 | Ht 71.0 in | Wt 218.8 lb

## 2020-05-21 DIAGNOSIS — I6522 Occlusion and stenosis of left carotid artery: Secondary | ICD-10-CM | POA: Diagnosis present

## 2020-05-21 DIAGNOSIS — I6389 Other cerebral infarction: Secondary | ICD-10-CM

## 2020-05-21 DIAGNOSIS — I10 Essential (primary) hypertension: Secondary | ICD-10-CM

## 2020-05-21 MED ORDER — IRBESARTAN-HYDROCHLOROTHIAZIDE 150-12.5 MG PO TABS: 2.0000 | ORAL_TABLET | Freq: Every day | ORAL | 6 refills | Status: DC

## 2020-05-21 NOTE — Telephone Encounter (Signed)
**Note De-Identified Denessa Cavan Obfuscation** I started a Irbesartan/HCTZ PA through covermymeds: Key: [id number]

## 2020-05-21 NOTE — Progress Notes (Signed)
05/23/2020 Samuel George 1951/02/22 [id number]   HPI:  Samuel George is a 69 y.o. male patient of Dr Oval Linsey, who presents today for advanced hypertension clinic follow up.  He was noted to have developed hypertension in December 2020 and has struggled to get control since then, despite taking amlodipine, irbesartan and carvedilol.  After renal artery dopplers showed questionable stenosis vs FMD, patient was scheduled to see Dr. Fletcher Anon.  Before this could happen, patient was hospitalized with acute ischemic stroke with critical left ICA stenosis.  He had a TCAR on May 16 and after discharge was then seen by Dr. Fletcher Anon.  It was suggested to repeat renal dopplers only if BP spikes uncontrollably in the future, otherwise allow some elevation in pressure for a few more weeks before tapering down to guideline levels.  Dr. Fletcher Anon and Dr. Oval Linsey both mention "concern about underlying diagnosis of hyperaldosteronism", however patient does not recognize this name and there is no indication in Epic that renin/aldosterone labs have ever been drawn.    Today he is in the office and reports to feeling well.  He has been checking pressure twice daily and recording in the [facility] app.  From the last 2 weeks he shows an average of of around 145/83, varying slightly between mornings and evenings (see below).  States compliance with medications and no side effects or complaints.    Secondary hypertension: Renal artery stenosis 1-59% stenosis both arteries, uestion of mild fibromuscular dysplasia vs RAS  hypothyroid TSH 0.81 10/2019     Blood Pressure Goal:  130/80  Current Medications: irbesartan/hctz 300/12.5 mg qd  Family Hx: father died at 6 from cancer; mother died at 56 old age; 1 sister w/o health issues;   Social Hx: no tobacco; 2 beers per day; no regular caffeine  Diet: mostly home cooked meals, no added salt; good mix of f/v;   Exercise: hour on treadmill then weights, daily  Home BP  readings: from vivify - last 2 weeks show morning average 143/84 and evening average 147/81.  Heart rate average 81  Intolerances: nkda  Labs: 5/21:  Na 141, K 3.7, Glu 114, BUN 23, SCr 1.35  Wt Readings from Last 3 Encounters:  05/21/20 218 lb (98.9 kg)  05/21/20 220 lb (99.8 kg)  05/21/20 218 lb 12.8 oz (99.2 kg)   BP Readings from Last 3 Encounters:  05/21/20 (!) 164/70  05/21/20 (!) 145/63  05/21/20 130/62   Pulse Readings from Last 3 Encounters:  05/21/20 69  05/21/20 72  05/21/20 (!) 52    Current Outpatient Medications  Medication Sig Dispense Refill  . amLODipine (NORVASC) 10 MG tablet Take 1 tablet (10 mg total) by mouth daily. 90 tablet 3  . aspirin EC 325 MG EC tablet Take 1 tablet (325 mg total) by mouth daily. 30 tablet 0  . atorvastatin (LIPITOR) 40 MG tablet Take 1 tablet (40 mg total) by mouth daily. 30 tablet 1  . clopidogrel (PLAVIX) 75 MG tablet Take 1 tablet (75 mg total) by mouth daily. 30 tablet 1  . Multiple Vitamin (MULTIVITAMIN WITH MINERALS) TABS tablet Take 1 tablet by mouth daily.    . irbesartan-hydrochlorothiazide (AVALIDE) 150-12.5 MG tablet Take 2 tablets by mouth daily. 60 tablet 6   No current facility-administered medications for this visit.    No Known Allergies  Past Medical History:  Diagnosis Date  . Hypertension     Blood pressure 130/62, pulse (!) 52, height 5\' 11"  (1.803 m), weight 218  lb 12.8 oz (99.2 kg).  Essential hypertension While his BP in the office today is at goal, his home readings over the past two weeks have been 15 points higher.  Will have him increase medication to irbesartan/hctz 300/25 mg.  Because they don't make this tablet strength, he will need to take 2 of the 125/12.5 mg tablets once daily.  Patient voiced understanding of this.  He has about a week supply of the 300//12.5 mg and will use those up before switching medications.  We will see him again in a month for visit 2/3 in the advanced hypertension  clinic.  He is to continue with twice daily BP monitoring with Vivify, and get a repeat BMET about 10-14 days after increasing antihypertensive medications.   Tommy Medal PharmD CPP [facility] [address] [place], [place] [address] [phone]

## 2020-05-21 NOTE — Patient Instructions (Signed)
Return for a a follow up appointment July 29 at 9 am  Go to the lab in 2 weeks - after the July 4th holiday  Check your blood pressure at home daily and keep record of the readings.  Take your BP meds as follows:  When you finish the irbesartan hctz 300/12.5 mg, switch to the 150/12.5 mg tablets and take 2 each day (at the same time)  Bring all of your meds, your BP cuff and your record of home blood pressures to your next appointment.  Exercise as you're able, try to walk approximately 30 minutes per day.  Keep salt intake to a minimum, especially watch canned and prepared boxed foods.  Eat more fresh fruits and vegetables and fewer canned items.  Avoid eating in fast food restaurants.    HOW TO TAKE YOUR BLOOD PRESSURE: . Rest 5 minutes before taking your blood pressure. .  Don't smoke or drink caffeinated beverages for at least 30 minutes before. . Take your blood pressure before (not after) you eat. . Sit comfortably with your back supported and both feet on the floor (don't cross your legs). . Elevate your arm to heart level on a table or a desk. . Use the proper sized cuff. It should fit smoothly and snugly around your bare upper arm. There should be enough room to slip a fingertip under the cuff. The bottom edge of the cuff should be 1 inch above the crease of the elbow. . Ideally, take 3 measurements at one sitting and record the average.

## 2020-05-21 NOTE — Progress Notes (Signed)
I agree with the above plan 

## 2020-05-21 NOTE — Progress Notes (Signed)
Patient name: Samuel George MRN: [id number] DOB: Dec 21, 1950 Sex: male  REASON FOR VISIT: Postop check status post left TCAR  HPI: [name] is a 69 y.o. male that presents for postop check after left TCAR on 04/18/2020 for symptomatic high-grade stenosis with string sign and high cervical lesion.  Ultimately he was discharged without any significant events.  On follow-up today he has been taking his aspirin Plavix and statin.  He reports no neurologic events since discharge.  Neck incision is healing up without issues.  Back to work.  Past Medical History:  Diagnosis Date  . Hypertension     Past Surgical History:  Procedure Laterality Date  . TRANSCAROTID ARTERY REVASCULARIZATION Left 04/18/2020   Procedure: TRANSCAROTID ARTERY REVASCULARIZATION LEFT;  Surgeon: Marty Heck, MD;  Location: [facility] OR;  Service: Vascular;  Laterality: Left;    Family History  Problem Relation Age of Onset  . Hypertension Mother     SOCIAL HISTORY: Social History   Tobacco Use  . Smoking status: Never Smoker  . Smokeless tobacco: Never Used  Substance Use Topics  . Alcohol use: Not on file    No Known Allergies  Current Outpatient Medications  Medication Sig Dispense Refill  . amLODipine (NORVASC) 10 MG tablet Take 1 tablet (10 mg total) by mouth daily. 90 tablet 3  . aspirin EC 325 MG EC tablet Take 1 tablet (325 mg total) by mouth daily. 30 tablet 0  . atorvastatin (LIPITOR) 40 MG tablet Take 1 tablet (40 mg total) by mouth daily. 30 tablet 1  . clopidogrel (PLAVIX) 75 MG tablet Take 1 tablet (75 mg total) by mouth daily. 30 tablet 1  . irbesartan-hydrochlorothiazide (AVALIDE) 150-12.5 MG tablet Take 2 tablets by mouth daily. 60 tablet 6  . Multiple Vitamin (MULTIVITAMIN WITH MINERALS) TABS tablet Take 1 tablet by mouth daily.     No current facility-administered medications for this visit.    REVIEW OF SYSTEMS:  [X]  denotes positive finding, [ ]  denotes negative  finding Cardiac  Comments:  Chest pain or chest pressure:    Shortness of breath upon exertion:    Short of breath when lying flat:    Irregular heart rhythm:        Vascular    Pain in calf, thigh, or hip brought on by ambulation:    Pain in feet at night that wakes you up from your sleep:     Blood clot in your veins:    Leg swelling:         Pulmonary    Oxygen at home:    Productive cough:     Wheezing:         Neurologic    Sudden weakness in arms or legs:     Sudden numbness in arms or legs:     Sudden onset of difficulty speaking or slurred speech:    Temporary loss of vision in one eye:     Problems with dizziness:         Gastrointestinal    Blood in stool:     Vomited blood:         Genitourinary    Burning when urinating:     Blood in urine:        Psychiatric    Major depression:         Hematologic    Bleeding problems:    Problems with blood clotting too easily:        Skin    Rashes  or ulcers:        Constitutional    Fever or chills:      PHYSICAL EXAM: Vitals:   05/21/20 1611  BP: (!) 164/70  Pulse: 69  Resp: 18  Temp: (!) 97.3 F (36.3 C)  TempSrc: Temporal  SpO2: 99%  Weight: 218 lb (98.9 kg)  Height: 5\' 11"  (1.803 m)    GENERAL: The patient is a well-nourished male, in no acute distress. The vital signs are documented above. CARDIAC: There is a regular rate and rhythm.  VASCULAR:  Left neck incision well-healed. PULMONARY: There is good air exchange bilaterally without wheezing or rales. ABDOMEN: Soft and non-tender with normal pitched bowel sounds.  MUSCULOSKELETAL: There are no major deformities or cyanosis. NEUROLOGIC: No focal weakness or paresthesias are detected.  CN II-XII grossly intact.    DATA:   I independently reviewed his carotid duplex and the left ICA stent looks widely patent.  His contralateral right ICA shows no significant stenosis.  Assessment/Plan:  69 year old male status post left TCAR on 04/18/2020  for symptomatic high-grade stenosis with string sign and high cervical lesion.  He has done very well since surgery and his left ICA stent looks widely patent on duplex today.  Discussed that he should continue aspirin statin at a minimum and Plavix if there are no other issues.  I will have him follow-up in 9 months with a carotid duplex for ongoing surveillance.  Call with questions or concerns.   Marty Heck, MD [facility] Office: [place]

## 2020-05-21 NOTE — Progress Notes (Signed)
[facility] [address] [place]. [place] [address] [phone]       HOSPITAL FOLLOW UP NOTE  [name] Date of Birth:  01-18-1951 Medical Record Number:  [id number]   Reason for Referral:  hospital stroke follow up    SUBJECTIVE:   CHIEF COMPLAINT:  Chief Complaint  Patient presents with  . Follow-up    Tx rm here for a stroke f/u.    HPI:   [name] is a 69 y.o. male with history of HTN  who presented on 04/14/2020 with transient speech difficulty, facial droop and incoordination in setting of recent cognitive decline.   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 Mild proximal L supraclinoid ICA stenosis. Hypoplastic VB circulation. Aortic atherosclerosis.   MRI  Punctate inferior L frontal lobe, L parietal, L precentral gyrus infarcts. Multiple old L corona radiata lacunes.  Carotid Doppler  L ICA 80-99% stenosis   2D Echo EF 60 to 65%  LDL 109  HgbA1c 5.8  UDS negative  Lovenox 40 mg sq daily for VTE prophylaxis  aspirin 81 mg daily prior to admission, now on aspirin 325 mg daily and clopidogrel 75 mg daily following plavix load.  Continue DAPT for 3 weeks and then Plavix alone.  Therapy recommendations:  No therapy needs  Disposition: Home  Carotid Stenosis   CTA neck L ICA string sign  Carotid Doppler  L ICA 80-99% stenosis   Vascular surgery Dr. Carlis Abbott on board  L TCAR 04/18/2020 completed by Dr. Carlis Abbott  Essential Hypertension  Stable  Permissive hypertension (OK if < 220/120) for 24 to 48 hours and then gradually normalize in 3 to 5 days.  SBP goal 130-150 before revascularization given L ICA stenosis, after left CEA, long-term BP goal normotensive.  Hyperlipidemia  Home meds:  No statin  No on lipitor 80  LDL 109, goal < 70  Continue statin at  discharge  Today, 05/21/2020, Samuel George is being seen for stroke follow up. He has been doing well from stroke standpoint without residual deficits or new/reoccurring stroke/TIA symptoms.  He remains on aspirin and Plavix per vascular surgery recommendations without bleeding or bruising.  Continues on atorvastatin without myalgias.  Blood pressure today 145/63.  He does have scheduled carotid ultrasound and follow-up with vascular surgery Dr. Carlis Abbott today therefore today's appointment cut short.  He has no concerns at this time.     ROS:    14 system review of systems performed and negative with exception of no complaints  PMH:  Past Medical History:  Diagnosis Date  . Hypertension     PSH:  Past Surgical History:  Procedure Laterality Date  . TRANSCAROTID ARTERY REVASCULARIZATION Left 04/18/2020   Procedure: TRANSCAROTID ARTERY REVASCULARIZATION LEFT;  Surgeon: Marty Heck, MD;  Location: [facility] OR;  Service: Vascular;  Laterality: Left;    Social History:  Social History   Socioeconomic History  . Marital status: Married    Spouse name: Not on file  . Number of children: Not on file  . Years of education: Not on file  . Highest education level: Not on file  Occupational History  . Not on file  Tobacco Use  . Smoking status: Never Smoker  . Smokeless tobacco: Never Used  Vaping Use  . Vaping Use: Never used  Substance and Sexual  Activity  . Alcohol use: Not on file  . Drug use: Not on file  . Sexual activity: Not on file  Other Topics Concern  . Not on file  Social History Narrative  . Not on file   Social Determinants of Health   Financial Resource Strain:   . Difficulty of Paying Living Expenses:   Food Insecurity: No Food Insecurity  . Worried About Charity fundraiser in the Last Year: Never true  . Ran Out of Food in the Last Year: Never true  Transportation Needs: No Transportation Needs  . Lack of Transportation (Medical): No  . Lack of  Transportation (Non-Medical): No  Physical Activity: Insufficiently Active  . Days of Exercise per Week: 3 days  . Minutes of Exercise per Session: 30 min  Stress: No Stress Concern Present  . Feeling of Stress : Not at all  Social Connections:   . Frequency of Communication with Friends and Family:   . Frequency of Social Gatherings with Friends and Family:   . Attends Religious Services:   . Active Member of Clubs or Organizations:   . Attends Archivist Meetings:   [name] Marital Status:   Intimate Partner Violence:   . Fear of Current or Ex-Partner:   . Emotionally Abused:   [name] Physically Abused:   . Sexually Abused:     Family History:  Family History  Problem Relation Age of Onset  . Hypertension Mother     Medications:   Current Outpatient Medications on File Prior to Visit  Medication Sig Dispense Refill  . amLODipine (NORVASC) 10 MG tablet Take 1 tablet (10 mg total) by mouth daily. 90 tablet 3  . aspirin EC 325 MG EC tablet Take 1 tablet (325 mg total) by mouth daily. 30 tablet 0  . atorvastatin (LIPITOR) 40 MG tablet Take 1 tablet (40 mg total) by mouth daily. 30 tablet 1  . clopidogrel (PLAVIX) 75 MG tablet Take 1 tablet (75 mg total) by mouth daily. 30 tablet 1  . Multiple Vitamin (MULTIVITAMIN WITH MINERALS) TABS tablet Take 1 tablet by mouth daily.     No current facility-administered medications on file prior to visit.    Allergies:  No Known Allergies    OBJECTIVE:  Physical Exam  Vitals:   05/21/20 1402  BP: (!) 145/63  Pulse: 72  Weight: 220 lb (99.8 kg)  Height: 5\' 11"  (1.803 m)   Body mass index is 30.68 kg/m. No exam data present  Did not obtain physical exam due to visit being cut short    ASSESSMENT/PLAN: Samuel George is a 69 y.o. year old male presented with transient speech difficulty, facial droop and incoordination in setting of recent cognitive decline on 04/14/2020 with stroke work-up revealing left MCA watershed punctate  infarcts likely secondary to large vessel disease from severe left ICA stenosis s/p L TCAR. Vascular risk factors include HTN, HLD, carotid stenosis and advanced age.  He recovered well without residual deficits.     1. Left MCA stroke: Continue aspirin 81 mg daily and clopidogrel 75 mg daily  and atorvastatin for secondary stroke prevention. Maintain strict control of hypertension with blood pressure goal below 130/90, diabetes with hemoglobin A1c goal below 6.5% and cholesterol with LDL cholesterol (bad cholesterol) goal below 70 mg/dL.  I also advised the patient to eat a healthy diet with plenty of whole grains, cereals, fruits and vegetables, exercise regularly with at least 30 minutes of continuous activity daily and maintain ideal body  weight. 2. L carotid stenosis s/p TCAR: Continue DAPT with ongoing duration determined by vascular surgery Dr. Carlis Abbott.  He does have follow-up visit and carotid ultrasound scheduled today therefore today's visit cut short due to provided running behind and importance of obtaining repeat carotid ultrasound as he attempted to reschedule but was not able to have sooner visit than 30 days. 3. HTN: Stable.  Continue to follow with PCP for monitoring and management 4. HLD: Continuation of atorvastatin and continue to follow with PCP for prescribing, monitoring and management   As he reports doing well, recommend follow-up in 3 months or call earlier if needed   Today's visit will be no charge as it was cut short due to scheduling conflict   Frann Rider, [facility]  [facility] [address] [place], [place] [address]  Phone [phone] Fax [phone] Note: This document was prepared with digital dictation and possible smart phrase technology. Any transcriptional errors that result from this process are unintentional.

## 2020-05-23 ENCOUNTER — Other Ambulatory Visit: Payer: Self-pay | Admitting: *Deleted

## 2020-05-23 ENCOUNTER — Encounter: Payer: Self-pay | Admitting: Pharmacist Clinician (PhC)/ Clinical Pharmacy Specialist

## 2020-05-23 DIAGNOSIS — I6522 Occlusion and stenosis of left carotid artery: Secondary | ICD-10-CM

## 2020-05-23 NOTE — Assessment & Plan Note (Signed)
While his BP in the office today is at goal, his home readings over the past two weeks have been 15 points higher.  Will have him increase medication to irbesartan/hctz 300/25 mg.  Because they don't make this tablet strength, he will need to take 2 of the 125/12.5 mg tablets once daily.  Patient voiced understanding of this.  He has about a week supply of the 300//12.5 mg and will use those up before switching medications.  We will see him again in a month for visit 2/3 in the advanced hypertension clinic.  He is to continue with twice daily BP monitoring with Vivify, and get a repeat BMET about 10-14 days after increasing antihypertensive medications.

## 2020-06-05 LAB — BASIC METABOLIC PANEL
BUN/Creatinine Ratio: 24 (ref 10–24)
BUN: 50 mg/dL — ABNORMAL HIGH (ref 8–27)
CO2: 22 mmol/L (ref 20–29)
Calcium: 9.5 mg/dL (ref 8.6–10.2)
Chloride: 102 mmol/L (ref 96–106)
Creatinine, Ser: 2.09 mg/dL — ABNORMAL HIGH (ref 0.76–1.27)
GFR calc Af Amer: 36 mL/min/{1.73_m2} — ABNORMAL LOW (ref >59)
GFR calc non Af Amer: 31 mL/min/{1.73_m2} — ABNORMAL LOW (ref >59)
Glucose: 104 mg/dL — ABNORMAL HIGH (ref 65–99)
Potassium: 5.2 mmol/L (ref 3.5–5.2)
Sodium: 139 mmol/L (ref 134–144)

## 2020-06-06 ENCOUNTER — Other Ambulatory Visit: Payer: Self-pay | Admitting: Pharmacist Clinician (PhC)/ Clinical Pharmacy Specialist

## 2020-06-06 DIAGNOSIS — I10 Essential (primary) hypertension: Secondary | ICD-10-CM

## 2020-06-06 MED ORDER — ATORVASTATIN CALCIUM 40 MG PO TABS: 40.0000 mg | ORAL_TABLET | Freq: Every day | ORAL | 3 refills | Status: DC

## 2020-06-06 MED ORDER — CLOPIDOGREL BISULFATE 75 MG PO TABS: 75.0000 mg | ORAL_TABLET | Freq: Every day | ORAL | 3 refills | Status: DC

## 2020-06-06 MED ORDER — AMLODIPINE BESYLATE 10 MG PO TABS: 10.0000 mg | ORAL_TABLET | Freq: Every day | ORAL | 3 refills | Status: DC

## 2020-06-06 NOTE — Telephone Encounter (Signed)
See refill encounter

## 2020-06-06 NOTE — Telephone Encounter (Signed)
Labs show increase in SCr from 1.35 to 2.09 with increase of hctz from 12.5 to 25 mg.  Pt also on irbesartan 300 mg daily.  States drinks 6+ 12-16 oz water bottles per day.    Home BP readings all WNL for past few weeks in [place].  Asked patient to d/c irbesartan/hctz completely for now and repeat metabolic panel on Monday.  Patient voiced understanding.

## 2020-06-07 ENCOUNTER — Telehealth: Payer: Self-pay | Admitting: *Deleted

## 2020-06-07 NOTE — Telephone Encounter (Signed)
-----   Message from Skeet Latch, MD sent at 06/06/2020  6:11 PM EDT ----- Kidney function has worsened.  Recommend that he go back to one tablet of irbesartan/HCTZ and add doxazosin 2mg  qhs instead.

## 2020-06-07 NOTE — Telephone Encounter (Signed)
Spoke with patient and Alena Bills D spoke advised yesterday to stop Irbesartan/HCTZ for now and recheck labs Monday.

## 2020-06-10 LAB — BASIC METABOLIC PANEL
BUN/Creatinine Ratio: 22 (ref 10–24)
BUN: 42 mg/dL — ABNORMAL HIGH (ref 8–27)
CO2: 20 mmol/L (ref 20–29)
Calcium: 9.3 mg/dL (ref 8.6–10.2)
Chloride: 104 mmol/L (ref 96–106)
Creatinine, Ser: 1.9 mg/dL — ABNORMAL HIGH (ref 0.76–1.27)
GFR calc Af Amer: 41 mL/min/{1.73_m2} — ABNORMAL LOW (ref >59)
GFR calc non Af Amer: 35 mL/min/{1.73_m2} — ABNORMAL LOW (ref >59)
Glucose: 126 mg/dL — ABNORMAL HIGH (ref 65–99)
Potassium: 4.8 mmol/L (ref 3.5–5.2)
Sodium: 139 mmol/L (ref 134–144)

## 2020-06-11 MED ORDER — DOXAZOSIN MESYLATE ER 4 MG PO TB24: 4.0000 mg | ORAL_TABLET | Freq: Every day | ORAL | 5 refills | Status: DC

## 2020-06-11 NOTE — Telephone Encounter (Signed)
Spoke with patient.  He sent a list of current medications - in addition to what we had him on he was also taking hctz 12.5 mg from PCP (in addition to the irbesartan hct we prescribed) and carvedilol 3.125 in addition to Bystolic 20.    Had him discontinue carvedilol and hctz.  He will continue spironolactone 25, Bystolic 20 and amlodipine 10.  Added doxazosin 4 mg each night to his schedule.

## 2020-06-13 ENCOUNTER — Other Ambulatory Visit: Payer: Self-pay | Admitting: Pharmacist Clinician (PhC)/ Clinical Pharmacy Specialist

## 2020-06-13 MED ORDER — DOXAZOSIN MESYLATE 4 MG PO TABS: 4.0000 mg | ORAL_TABLET | Freq: Every day | ORAL | 5 refills | Status: DC

## 2020-06-18 ENCOUNTER — Telehealth: Payer: Self-pay | Admitting: General Practice

## 2020-06-18 NOTE — Telephone Encounter (Signed)
Mr. Zuckerman blood pressure results vivify.  Last blood pressure reading 124/58.  No medication recommendations at this time.  We will continue to monitor.

## 2020-06-19 ENCOUNTER — Other Ambulatory Visit: Payer: Self-pay | Admitting: Pharmacist Clinician (PhC)/ Clinical Pharmacy Specialist

## 2020-06-19 DIAGNOSIS — I10 Essential (primary) hypertension: Secondary | ICD-10-CM

## 2020-06-21 NOTE — Telephone Encounter (Signed)
**Note De-Identified Rene Sizelove Obfuscation** Message received through covermymeds:  [name]: [id number] - PA Case ID: [id number] - Rx #: [id number]  Outcome  Approved on June 22  Request Reference Number: [id number].  IRBESAR/HCTZ TAB 150-12.5 is approved through 11/29/2020.  Your patient may now fill this prescription and it will be covered.  Drug Irbesartan-hydroCHLOROthiazide 150-12.5MG  tablets  FormOptumRx Medicare Part D Electronic Prior Authorization Form [phone] NCPDP)

## 2020-06-24 ENCOUNTER — Encounter (HOSPITAL_COMMUNITY): Payer: Medicare Other

## 2020-06-25 ENCOUNTER — Ambulatory Visit: Payer: Medicare Other | Admitting: Vascular Surgery

## 2020-06-27 ENCOUNTER — Other Ambulatory Visit: Payer: Self-pay

## 2020-06-27 ENCOUNTER — Ambulatory Visit (INDEPENDENT_AMBULATORY_CARE_PROVIDER_SITE_OTHER): Payer: Medicare Other | Admitting: Pharmacist Clinician (PhC)/ Clinical Pharmacy Specialist

## 2020-06-27 DIAGNOSIS — I10 Essential (primary) hypertension: Secondary | ICD-10-CM

## 2020-06-27 LAB — BASIC METABOLIC PANEL
BUN/Creatinine Ratio: 15 (ref 10–24)
BUN: 19 mg/dL (ref 8–27)
CO2: 22 mmol/L (ref 20–29)
Calcium: 8.9 mg/dL (ref 8.6–10.2)
Chloride: 105 mmol/L (ref 96–106)
Creatinine, Ser: 1.28 mg/dL — ABNORMAL HIGH (ref 0.76–1.27)
GFR calc Af Amer: 66 mL/min/{1.73_m2} (ref >59)
GFR calc non Af Amer: 57 mL/min/{1.73_m2} — ABNORMAL LOW (ref >59)
Glucose: 83 mg/dL (ref 65–99)
Potassium: 4.5 mmol/L (ref 3.5–5.2)
Sodium: 140 mmol/L (ref 134–144)

## 2020-06-27 NOTE — Patient Instructions (Addendum)
Return for a a follow up appointment   Go to the lab today to repeat kidney function labs  Check your blood pressure at home daily (if able) and keep record of the readings.  Take your BP meds as follows:  Stop carvedilol.    Continue with amlodipine, doxazosin, Bystolic and spironolactone  We will call you later this afternoon once the blood work has been reviewed.    Bring all of your meds, your BP cuff and your record of home blood pressures to your next appointment.  Exercise as you're able, try to walk approximately 30 minutes per day.  Keep salt intake to a minimum, especially watch canned and prepared boxed foods.  Eat more fresh fruits and vegetables and fewer canned items.  Avoid eating in fast food restaurants.    HOW TO TAKE YOUR BLOOD PRESSURE: . Rest 5 minutes before taking your blood pressure. .  Don't smoke or drink caffeinated beverages for at least 30 minutes before. . Take your blood pressure before (not after) you eat. . Sit comfortably with your back supported and both feet on the floor (don't cross your legs). . Elevate your arm to heart level on a table or a desk. . Use the proper sized cuff. It should fit smoothly and snugly around your bare upper arm. There should be enough room to slip a fingertip under the cuff. The bottom edge of the cuff should be 1 inch above the crease of the elbow. . Ideally, take 3 measurements at one sitting and record the average.

## 2020-06-27 NOTE — Progress Notes (Signed)
07/01/2020 Samuel George 04-11-1951 [id number]   HPI:  Samuel George is a 69 y.o. male patient of Dr Oval Linsey, who presents today for advanced hypertension clinic follow up.  He was noted to have developed hypertension in December 2020 and has struggled to get control since then, despite taking amlodipine, irbesartan and carvedilol.  After renal artery dopplers showed questionable stenosis vs FMD, patient was scheduled to see Dr. Fletcher Anon.  Before this could happen, patient was hospitalized with acute ischemic stroke with critical left ICA stenosis.  He had a TCAR on May 16 and after discharge was then seen by Dr. Fletcher Anon.  It was suggested to repeat renal dopplers only if BP spikes uncontrollably in the future, otherwise allow some elevation in pressure for a few more weeks before tapering down to guideline levels.  Dr. Fletcher Anon and Dr. Oval Linsey both mention "concern about underlying diagnosis of hyperaldosteronism", however patient does not recognize this name and there is no indication in Epic that renin/aldosterone labs have ever been drawn.    Today he is in the office and reports to feeling well.  He has been checking pressure twice daily and recording in the [facility] app.  From the last 2 weeks he shows an average of of around 145/83, varying slightly between mornings and evenings (see below).  States compliance with medications and no side effects or complaints.    Secondary hypertension: Renal artery stenosis 1-59% stenosis both arteries, uestion of mild fibromuscular dysplasia vs RAS  hypothyroid TSH 0.81 10/2019     Blood Pressure Goal:  130/80  Current Medications: amlodipine 10 mg qd, doxazosin 4 mg qhs, carvedilol 7.353 mg bid, Bystolic 20 mg qd,  spironolactone 25 mg qd  Family Hx: father died at 67 from cancer; mother died at 9 old age; 1 sister w/o health issues;   Social Hx: no tobacco; 2 beers per day; no regular caffeine  Diet: mostly home cooked meals, no added salt; good mix  of f/v;   Exercise: hour on treadmill then weights, daily  Home BP readings: from vivify - Last 2 weeks show morning average 139/67 and evening average 137/73.  Heart rate average 72 last month in 2 wk period - show morning average 143/84 and evening average 147/81.  Heart rate average 81  Intolerances: nkda  Labs: 5/21:  Na 141, K 3.7, Glu 114, BUN 23, SCr 1.35  Wt Readings from Last 3 Encounters:  06/27/20 (!) 228 lb (103.4 kg)  05/21/20 218 lb (98.9 kg)  05/21/20 220 lb (99.8 kg)   BP Readings from Last 3 Encounters:  06/27/20 (!) 146/82  05/21/20 (!) 164/70  05/21/20 (!) 145/63   Pulse Readings from Last 3 Encounters:  06/27/20 65  05/21/20 69  05/21/20 72    Current Outpatient Medications  Medication Sig Dispense Refill  . amLODipine (NORVASC) 10 MG tablet Take 1 tablet (10 mg total) by mouth daily. 90 tablet 3  . aspirin EC 325 MG EC tablet Take 1 tablet (325 mg total) by mouth daily. 30 tablet 0  . atorvastatin (LIPITOR) 40 MG tablet Take 1 tablet (40 mg total) by mouth daily. 90 tablet 3  . carvedilol (COREG) 3.125 MG tablet Take 3.125 mg by mouth 2 (two) times daily with a meal. Take one tablet once a day    . clopidogrel (PLAVIX) 75 MG tablet Take 1 tablet (75 mg total) by mouth daily. 90 tablet 3  . doxazosin (CARDURA) 4 MG tablet Take 1 tablet (4 mg  total) by mouth daily. 30 tablet 5  . irbesartan-hydrochlorothiazide (AVALIDE) 150-12.5 MG tablet Take 1 tablet by mouth daily. Take one tablet once daily    . Multiple Vitamin (MULTIVITAMIN WITH MINERALS) TABS tablet Take 1 tablet by mouth daily.    . nebivolol (BYSTOLIC) 10 MG tablet Take 10 mg by mouth daily. TAKE 2 TABLETS ONCE A DAY    . sildenafil (VIAGRA) 50 MG tablet Take 50 mg by mouth daily as needed for erectile dysfunction. Take one tablet as needed for erectile dysfunction    . spironolactone (ALDACTONE) 25 MG tablet Take 25 mg by mouth daily. Take 1 tablet Once Daily     No current facility-administered  medications for this visit.    No Known Allergies  Past Medical History:  Diagnosis Date  . Hypertension     Blood pressure (!) 146/82, pulse 65, resp. rate 14, height 5\' 11"  (1.803 m), weight (!) 228 lb (103.4 kg), SpO2 96 %.  Essential hypertension Patient with essential hypertension, doing much better overall, but still not quite to goal.  BMET drawn this morning show that SCr has returned to 1.25, from a high of 2.09.  Will have him re-start the irbesartan hctz 150/12.5 mg once daily.  He is to stop carvedilol, as he is also on nebivolol.  He is to continue with regular home BP monitoring and we will see him back in one month for follow up.     Tommy Medal PharmD CPP [facility] [address] [place], [place] [address] [phone]

## 2020-06-28 ENCOUNTER — Encounter: Payer: Self-pay | Admitting: Pharmacist Clinician (PhC)/ Clinical Pharmacy Specialist

## 2020-06-28 NOTE — Assessment & Plan Note (Addendum)
Patient with essential hypertension, doing much better overall, but still not quite to goal.  BMET drawn this morning show that SCr has returned to 1.25, from a high of 2.09.  Will have him re-start the irbesartan hctz 150/12.5 mg once daily.  He is to stop carvedilol, as he is also on nebivolol.  He is to continue with regular home BP monitoring and we will see him back in one month for follow up.

## 2020-07-09 ENCOUNTER — Other Ambulatory Visit: Payer: Self-pay

## 2020-07-09 ENCOUNTER — Encounter: Payer: Self-pay | Admitting: Cardiovascular Disease

## 2020-07-09 ENCOUNTER — Ambulatory Visit: Payer: Medicare Other | Admitting: Cardiovascular Disease

## 2020-07-09 VITALS — BP 120/74 | HR 68 | Temp 96.4°F | Ht 71.0 in | Wt 228.4 lb

## 2020-07-09 DIAGNOSIS — E785 Hyperlipidemia, unspecified: Secondary | ICD-10-CM | POA: Diagnosis not present

## 2020-07-09 DIAGNOSIS — I701 Atherosclerosis of renal artery: Secondary | ICD-10-CM | POA: Diagnosis not present

## 2020-07-09 DIAGNOSIS — I10 Essential (primary) hypertension: Secondary | ICD-10-CM | POA: Diagnosis not present

## 2020-07-09 NOTE — Progress Notes (Signed)
Cardiology Office Note   Date:  07/09/2020   ID:  Samuel George, DOB 1951/07/27, MRN [id number]  PCP:  London Pepper, MD  Cardiologist: Dr. Oval Linsey and [name]  No chief complaint on file.     History of Present Illness: Samuel George is a 69 y.o. male who is here today for a follow-up visit regarding mild renal artery stenosis.   He has known history of hypertension and PVCs.  He was diagnosed with hypertension in December of last year and his blood pressure has been difficult to control since then.  There was some concern about underlying hyperaldosteronism.  He had intolerance to some antihypertensive medication. He had a stroke few months ago and was found to have critical left carotid stenosis.  He underwent TCAR during his hospitalization.    Plavix and atorvastatin were added.  He had renal artery duplex in April which showed mild nonobstructive bilateral renal artery stenosis less than 60% with some changes suggestive of mild fibromuscular dysplasia.   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   The patient is on multiple antihypertensive medications but his blood pressure seems to be reasonably controlled.  He did have some acute on chronic kidney disease but most recent creatinine was 1.28.  He denies chest pain or shortness of breath.  Past Medical History:  Diagnosis Date  . Hypertension     Past Surgical History:  Procedure Laterality Date  . TRANSCAROTID ARTERY REVASCULARIZATION Left 04/18/2020   Procedure: TRANSCAROTID ARTERY REVASCULARIZATION LEFT;  Surgeon: Marty Heck, MD;  Location: [facility];  Service: Vascular;  Laterality: Left;     Current Outpatient Medications  Medication Sig Dispense Refill  . amLODipine (NORVASC) 10 MG tablet Take 1 tablet (10 mg total) by mouth daily. 90 tablet 3  . aspirin EC 325 MG EC tablet Take 1 tablet (325 mg total) by mouth daily. 30 tablet 0  .  atorvastatin (LIPITOR) 40 MG tablet Take 1 tablet (40 mg total) by mouth daily. 90 tablet 3  . carvedilol (COREG) 3.125 MG tablet Take 3.125 mg by mouth 2 (two) times daily with a meal. Take one tablet once a day    . clopidogrel (PLAVIX) 75 MG tablet Take 1 tablet (75 mg total) by mouth daily. 90 tablet 3  . doxazosin (CARDURA) 4 MG tablet Take 1 tablet (4 mg total) by mouth daily. 30 tablet 5  . irbesartan-hydrochlorothiazide (AVALIDE) 150-12.5 MG tablet Take 1 tablet by mouth daily. Take one tablet once daily    . Multiple Vitamin (MULTIVITAMIN WITH MINERALS) TABS tablet Take 1 tablet by mouth daily.    . nebivolol (BYSTOLIC) 10 MG tablet Take 10 mg by mouth daily. TAKE 2 TABLETS ONCE A DAY    . sildenafil (VIAGRA) 50 MG tablet Take 50 mg by mouth daily as needed for erectile dysfunction. Take one tablet as needed for erectile dysfunction    . spironolactone (ALDACTONE) 25 MG tablet Take 25 mg by mouth daily. Take 1 tablet Once Daily     No current facility-administered medications for this visit.    Allergies:   Patient has no known allergies.    Social History:  The patient  reports that he has never smoked. He has never used smokeless tobacco.   Family History:  The patient's family history includes Hypertension in his mother.    ROS:  Please see the history of present illness.   Otherwise, review of  systems are positive for none.   All other systems are reviewed and negative.    PHYSICAL EXAM: VS:  BP 120/74   Pulse 68   Temp (!) 96.4 F (35.8 C)   Ht 5\' 11"  (1.803 m)   Wt 228 lb 6.4 oz (103.6 kg)   SpO2 98%   BMI 31.86 kg/m  , BMI Body mass index is 31.86 kg/m. GEN: Well nourished, well developed, in no acute distress  HEENT: normal  Neck: no JVD, carotid bruits, or masses Cardiac: RRR; no murmurs, rubs, or gallops,no edema  Respiratory:  clear to auscultation bilaterally, normal work of breathing GI: soft, nontender, nondistended, + BS MS: no deformity or atrophy    Skin: warm and dry, no rash Neuro:  Strength and sensation are intact Psych: euthymic mood, full affect   EKG:  EKG is not ordered today.    Recent Labs: 04/14/2020: ALT 25 04/19/2020: Hemoglobin 11.6; Platelets 233 06/27/2020: BUN 19; Creatinine, Ser 1.28; Potassium 4.5; Sodium 140    Lipid Panel    Component Value Date/Time   CHOL 186 04/15/2020 0311   TRIG 47 04/15/2020 0311   HDL 68 04/15/2020 0311   CHOLHDL 2.7 04/15/2020 0311   VLDL 9 04/15/2020 0311   LDLCALC 109 (H) 04/15/2020 0311      Wt Readings from Last 3 Encounters:  07/09/20 228 lb 6.4 oz (103.6 kg)  06/27/20 (!) 228 lb (103.4 kg)  05/21/20 218 lb (98.9 kg)       No flowsheet data found.    ASSESSMENT AND PLAN:  1.  Essential hypertension with possible underlying renal artery stenosis: The patient was diagnosed with severe hypertension late last year which raise the possibility of secondary hypertension.  Renal artery duplex  showed no critical stenosis in the renal arteries. His blood pressure is now well controlled on current medications.  No indication for renal artery angiography.  CTA or MRA can be considered in the future. He might be a candidate for renal denervation if it becomes approved.  2.  History of stroke: Status post likely due to critical left TCAR.   3.  Hyperlipidemia: On atorvastatin.    Disposition:   FU with me in 12 months  Signed,  Kathlyn Sacramento, MD  07/09/2020 7:59 AM    [place]

## 2020-07-09 NOTE — Patient Instructions (Signed)

## 2020-08-01 ENCOUNTER — Other Ambulatory Visit: Payer: Self-pay

## 2020-08-01 ENCOUNTER — Ambulatory Visit (INDEPENDENT_AMBULATORY_CARE_PROVIDER_SITE_OTHER): Payer: Medicare Other | Admitting: Pharmacist

## 2020-08-01 VITALS — BP 120/64 | HR 67 | Resp 16 | Ht 71.0 in | Wt 227.2 lb

## 2020-08-01 DIAGNOSIS — I10 Essential (primary) hypertension: Secondary | ICD-10-CM | POA: Diagnosis not present

## 2020-08-01 MED ORDER — IRBESARTAN-HYDROCHLOROTHIAZIDE 150-12.5 MG PO TABS: 1.0000 | ORAL_TABLET | Freq: Every day | ORAL | 3 refills | Status: DC

## 2020-08-01 NOTE — Progress Notes (Signed)
HPI:  Samuel George is a 69 y.o. male patient of Dr Oval Linsey, who presents today for advanced hypertension clinic follow up.  He was noted to have developed hypertension in December 2020 and has struggled to get control since then, despite taking amlodipine, irbesartan and carvedilol.  After renal artery dopplers showed questionable stenosis vs FMD, patient was scheduled to see Dr. Fletcher Anon.  Before this could happen, patient was hospitalized with acute ischemic stroke with critical left ICA stenosis.  He had a TCAR on May 16 and after discharge was then seen by Dr. Fletcher Anon.  It was suggested to repeat renal dopplers only if BP spikes uncontrollably in the future, otherwise allow some elevation in pressure for a few more weeks before tapering down to guideline levels.  Today he is in the office and reports to feeling well.  He has been checking pressure twice daily and recording in the [facility] app.  States compliance with medications and no side effects or complaints.    Secondary hypertension: Renal artery stenosis 1-59% stenosis both arteries, uestion of mild fibromuscular dysplasia vs RAS  hypothyroid TSH 0.81 10/2019     Blood Pressure Goal:  130/80  Current Medications:  amlodipine 10 mg daily doxazosin 4 mg at bedtime Irbesartan/HCT 150mg /12.5mg  daily Bystolic 10mg  daily Spironolactone 25 mg daily  Family Hx: father died at 53 from cancer; mother died at 61 old age; 1 sister w/o health issues;   Social Hx: no tobacco; 2 beers per day; no regular caffeine  Diet: mostly home cooked meals, no added salt; good mix of f/v  Exercise: hour on treadmill then weights, long runs on the weekends  Home BP readings: from vivify - 27 readings; average 130/64; HR range 56 - 67 bpm  Intolerances: nkda  Labs: 5/21:  Na 141, K 3.7, Glu 114, BUN 23, SCr 1.35  Wt Readings from Last 3 Encounters:  08/01/20 227 lb 3.2 oz (103.1 kg)  07/09/20 228 lb 6.4 oz (103.6 kg)  06/27/20 (!) 228 lb (103.4  kg)   BP Readings from Last 3 Encounters:  08/01/20 120/64  07/09/20 120/74  06/27/20 (!) 146/82   Pulse Readings from Last 3 Encounters:  08/01/20 67  07/09/20 68  06/27/20 65    Current Outpatient Medications  Medication Sig Dispense Refill  . amLODipine (NORVASC) 10 MG tablet Take 1 tablet (10 mg total) by mouth daily. 180 tablet 1  . aspirin 81 MG chewable tablet Chew by mouth daily.    07/10/20 atorvastatin (LIPITOR) 40 MG tablet Take 1 tablet (40 mg total) by mouth daily. 90 tablet 3  . clopidogrel (PLAVIX) 75 MG tablet Take 1 tablet (75 mg total) by mouth daily. 90 tablet 3  . doxazosin (CARDURA) 4 MG tablet Take 1 tablet (4 mg total) by mouth daily. 180 tablet 1  . irbesartan-hydrochlorothiazide (AVALIDE) 150-12.5 MG tablet Take 1 tablet by mouth daily. 180 tablet 1  . Multiple Vitamin (MULTIVITAMIN WITH MINERALS) TABS tablet Take 1 tablet by mouth daily.    . nebivolol (BYSTOLIC) 10 MG tablet Take 1 tablet (10 mg total) by mouth daily. 180 tablet 1  . sildenafil (VIAGRA) 50 MG tablet Take 50 mg by mouth daily as needed for erectile dysfunction. Take one tablet as needed for erectile dysfunction    . spironolactone (ALDACTONE) 25 MG tablet Take 1 tablet (25 mg total) by mouth daily. Take 1 tablet Once Daily 180 tablet 1   No current facility-administered medications for this visit.  No Known Allergies  Past Medical History:  Diagnosis Date  . Hypertension     Blood pressure 120/64, pulse 67, resp. rate 16, height 5\' 11"  (1.803 m), weight 227 lb 3.2 oz (103.1 kg), SpO2 97 %.  Essential hypertension Blood pressure well controlled during OV today and during f/u with Dr Fletcher Anon 3 weeks ago. Home BP readings average 130/64 per [employer] app. Patient denies issues with current therapy or side effects.  Will continue current medication as prescribed, and schedule follow up wuth Dr Oval Linsey for final visit with Adv hypertension clinic.     Lamika Connolly Rodriguez-Guzman PharmD, BCPS,  [place] [address] 08/02/2020 3:41 PM

## 2020-08-01 NOTE — Patient Instructions (Signed)
Return for a  follow up appointment 4 - 5 week with Dr Oval Linsey   Check your blood pressure at home daily (if able) and keep record of the readings.  Take your BP meds as follows: *NO MEDICATION CHANGE*  Bring all of your meds, your BP cuff and your record of home blood pressures to your next appointment.  Exercise as you're able, try to walk approximately 30 minutes per day.  Keep salt intake to a minimum, especially watch canned and prepared boxed foods.  Eat more fresh fruits and vegetables and fewer canned items.  Avoid eating in fast food restaurants.    HOW TO TAKE YOUR BLOOD PRESSURE: . Rest 5 minutes before taking your blood pressure. .  Don't smoke or drink caffeinated beverages for at least 30 minutes before. . Take your blood pressure before (not after) you eat. . Sit comfortably with your back supported and both feet on the floor (don't cross your legs). . Elevate your arm to heart level on a table or a desk. . Use the proper sized cuff. It should fit smoothly and snugly around your bare upper arm. There should be enough room to slip a fingertip under the cuff. The bottom edge of the cuff should be 1 inch above the crease of the elbow. . Ideally, take 3 measurements at one sitting and record the average.

## 2020-08-02 ENCOUNTER — Encounter: Payer: Self-pay | Admitting: Pharmacist

## 2020-08-02 MED ORDER — DOXAZOSIN MESYLATE 4 MG PO TABS: 4.0000 mg | ORAL_TABLET | Freq: Every day | ORAL | 1 refills | Status: DC

## 2020-08-02 MED ORDER — AMLODIPINE BESYLATE 10 MG PO TABS: 10.0000 mg | ORAL_TABLET | Freq: Every day | ORAL | 1 refills | Status: DC

## 2020-08-02 MED ORDER — NEBIVOLOL HCL 10 MG PO TABS: 10.0000 mg | ORAL_TABLET | Freq: Every day | ORAL | 1 refills | Status: DC

## 2020-08-02 MED ORDER — IRBESARTAN-HYDROCHLOROTHIAZIDE 150-12.5 MG PO TABS: 1.0000 | ORAL_TABLET | Freq: Every day | ORAL | 1 refills | Status: DC

## 2020-08-02 MED ORDER — SPIRONOLACTONE 25 MG PO TABS: 25.0000 mg | ORAL_TABLET | Freq: Every day | ORAL | 1 refills | Status: DC

## 2020-08-02 NOTE — Assessment & Plan Note (Signed)
Blood pressure well controlled during OV today and during f/u with Dr Fletcher Anon 3 weeks ago. Home BP readings average 130/64 per [employer] app. Patient denies issues with current therapy or side effects.  Will continue current medication as prescribed, and schedule follow up wuth Dr Oval Linsey for final visit with Adv hypertension clinic.

## 2020-08-20 NOTE — Addendum Note (Signed)
Addended by: Mike Craze on: 08/20/2020 01:52 PM   Modules accepted: Orders

## 2020-08-22 ENCOUNTER — Telehealth: Payer: Self-pay | Admitting: *Deleted

## 2020-08-22 NOTE — Telephone Encounter (Signed)
RE: New results from patient entered flowsheet Received: Yesterday [name], [name], NP  [name], LPN Much better blood pressure control. Acceptable heart rate. No changes needed in medication regimen at this time. Thank you.    Samuel George's recent [facility] Monitoring readings (past 60 days): Time Taken Time Submitted Systolic Blood Pressure Diastolic Blood Pressure Heart Rate Weight SpO2 Blood Glucose Blood Glucose Reading Type  08/22/2020 7:29 AM 08/22/2020 7:31 AM   59      08/22/2020 7:28 AM 08/22/2020 7:31 AM 133 58       08/21/2020 5:50 PM 08/21/2020 5:53 PM   64      08/21/2020 5:50 PM 08/21/2020 5:53 PM 133 61       08/21/2020 7:23 AM 08/21/2020 7:25 AM   65      08/21/2020 7:22 AM 08/21/2020 7:25 AM 133 68       08/20/2020 5:40 PM 08/20/2020 11:19 PM   62      08/20/2020 5:40 PM 08/20/2020 11:19 PM 132 66       08/20/2020 7:11 AM 08/20/2020 11:19 PM   62      08/20/2020 7:10 AM 08/20/2020 11:19 PM 131 67

## 2020-08-26 ENCOUNTER — Encounter: Payer: Self-pay | Admitting: Adult Health

## 2020-08-26 ENCOUNTER — Ambulatory Visit: Payer: Medicare Other | Admitting: Adult Health

## 2020-08-26 VITALS — BP 132/67 | HR 65 | Ht 71.0 in | Wt 227.0 lb

## 2020-08-26 DIAGNOSIS — I6522 Occlusion and stenosis of left carotid artery: Secondary | ICD-10-CM | POA: Diagnosis not present

## 2020-08-26 DIAGNOSIS — E785 Hyperlipidemia, unspecified: Secondary | ICD-10-CM | POA: Diagnosis not present

## 2020-08-26 DIAGNOSIS — I63512 Cerebral infarction due to unspecified occlusion or stenosis of left middle cerebral artery: Secondary | ICD-10-CM | POA: Diagnosis not present

## 2020-08-26 DIAGNOSIS — I10 Essential (primary) hypertension: Secondary | ICD-10-CM | POA: Diagnosis not present

## 2020-08-26 NOTE — Progress Notes (Signed)
I agree with the above plan 

## 2020-08-26 NOTE — Progress Notes (Signed)
[facility] [address] [place]. [place] [address] [phone]       STROKE FOLLOW UP NOTE  [name] Date of Birth:  19-Jan-1951 Medical Record Number:  [id number]   Reason for Referral: stroke follow up    SUBJECTIVE:   CHIEF COMPLAINT:  Chief Complaint  Patient presents with  . Follow-up    rm 9 Pt is not having any new sx. he has been stable since prior visit    HPI:   Today, 08/26/2020, Samuel George returns for stroke follow-up.  He has been doing well since prior visit without residual stroke deficits or new or reoccurring stroke/TIA symptoms.  Remains on aspirin and Plavix without bleeding or bruising.  Remains on atorvastatin without myalgias.  Blood pressure today 132/67 currently managed by advanced HTN clinic.  No concerns at this time.    History provided for reference purposes only Stroke admission [name] is a 69 y.o. male with history of HTN  who presented on 04/14/2020 with transient speech difficulty, facial droop and incoordination in setting of recent cognitive decline.   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 Mild proximal L supraclinoid ICA stenosis. Hypoplastic VB circulation. Aortic atherosclerosis.   MRI  Punctate inferior L frontal lobe, L parietal, L precentral gyrus infarcts. Multiple old L corona radiata lacunes.  Carotid Doppler  L ICA 80-99% stenosis   2D Echo EF 60 to 65%  LDL 109  HgbA1c 5.8  UDS negative  Lovenox 40 mg sq daily for VTE prophylaxis  aspirin 81 mg daily prior to admission, now on aspirin 325 mg daily and clopidogrel 75 mg daily following plavix load.  Continue DAPT for 3 weeks and then Plavix alone.  Therapy recommendations:  No therapy needs  Disposition: Home  Carotid Stenosis   CTA neck L ICA string sign  Carotid Doppler   L ICA 80-99% stenosis   Vascular surgery Dr. Carlis Abbott on board  L TCAR 04/18/2020 completed by Dr. Carlis Abbott  Essential Hypertension  Stable  Permissive hypertension (OK if < 220/120) for 24 to 48 hours and then gradually normalize in 3 to 5 days.  SBP goal 130-150 before revascularization given L ICA stenosis, after left CEA, long-term BP goal normotensive.  Hyperlipidemia  Home meds:  No statin  No on lipitor 80  LDL 109, goal < 70  Continue statin at discharge  Today, 05/21/2020, Samuel George is being seen for stroke follow up. He has been doing well from stroke standpoint without residual deficits or new/reoccurring stroke/TIA symptoms.  He remains on aspirin and Plavix per vascular surgery recommendations without bleeding or bruising.  Continues on atorvastatin without myalgias.  Blood pressure today 145/63.  He does have scheduled carotid ultrasound and follow-up with vascular surgery Dr. Carlis Abbott today therefore today's appointment cut short.  He has no concerns at this time.     ROS:    14 system review of systems performed and negative with exception of no complaints  PMH:  Past Medical History:  Diagnosis Date  . Hypertension     PSH:  Past Surgical History:  Procedure Laterality Date  . TRANSCAROTID ARTERY REVASCULARIZATION Left 04/18/2020   Procedure: TRANSCAROTID ARTERY REVASCULARIZATION LEFT;  Surgeon: Marty Heck, MD;  Location: [facility];  Service: Vascular;  Laterality: Left;    Social History:  Social  History   Socioeconomic History  . Marital status: Married    Spouse name: Not on file  . Number of children: Not on file  . Years of education: Not on file  . Highest education level: Not on file  Occupational History  . Not on file  Tobacco Use  . Smoking status: Never Smoker  . Smokeless tobacco: Never Used  Vaping Use  . Vaping Use: Never used  Substance and Sexual Activity  . Alcohol use: Not on file  . Drug use: Not on file  . Sexual  activity: Not on file  Other Topics Concern  . Not on file  Social History Narrative  . Not on file   Social Determinants of Health   Financial Resource Strain:   . Difficulty of Paying Living Expenses: Not on file  Food Insecurity: No Food Insecurity  . Worried About Charity fundraiser in the Last Year: Never true  . Ran Out of Food in the Last Year: Never true  Transportation Needs: No Transportation Needs  . Lack of Transportation (Medical): No  . Lack of Transportation (Non-Medical): No  Physical Activity: Insufficiently Active  . Days of Exercise per Week: 3 days  . Minutes of Exercise per Session: 30 min  Stress: No Stress Concern Present  . Feeling of Stress : Not at all  Social Connections:   . Frequency of Communication with Friends and Family: Not on file  . Frequency of Social Gatherings with Friends and Family: Not on file  . Attends Religious Services: Not on file  . Active Member of Clubs or Organizations: Not on file  . Attends Archivist Meetings: Not on file  . Marital Status: Not on file  Intimate Partner Violence:   . Fear of Current or Ex-Partner: Not on file  . Emotionally Abused: Not on file  . Physically Abused: Not on file  . Sexually Abused: Not on file    Family History:  Family History  Problem Relation Age of Onset  . Hypertension Mother     Medications:   Current Outpatient Medications on File Prior to Visit  Medication Sig Dispense Refill  . amLODipine (NORVASC) 10 MG tablet Take 1 tablet (10 mg total) by mouth daily. 180 tablet 1  . aspirin 81 MG chewable tablet Chew by mouth daily.    [name] atorvastatin (LIPITOR) 40 MG tablet Take 1 tablet (40 mg total) by mouth daily. 90 tablet 3  . clopidogrel (PLAVIX) 75 MG tablet Take 1 tablet (75 mg total) by mouth daily. 90 tablet 3  . doxazosin (CARDURA) 4 MG tablet Take 1 tablet (4 mg total) by mouth daily. 180 tablet 1  . irbesartan-hydrochlorothiazide (AVALIDE) 150-12.5 MG tablet Take 1  tablet by mouth daily. 180 tablet 1  . Multiple Vitamin (MULTIVITAMIN WITH MINERALS) TABS tablet Take 1 tablet by mouth daily.    . nebivolol (BYSTOLIC) 10 MG tablet Take 1 tablet (10 mg total) by mouth daily. 180 tablet 1  . sildenafil (VIAGRA) 50 MG tablet Take 50 mg by mouth daily as needed for erectile dysfunction. Take one tablet as needed for erectile dysfunction    . spironolactone (ALDACTONE) 25 MG tablet Take 1 tablet (25 mg total) by mouth daily. Take 1 tablet Once Daily 180 tablet 1   No current facility-administered medications on file prior to visit.    Allergies:  No Known Allergies    OBJECTIVE:  Physical Exam  Vitals:   08/26/20 0843  BP: 132/67  Pulse: 65  Weight: 227 lb (103 kg)  Height: 5\' 11"  (1.803 m)   Body mass index is 31.66 kg/m. No exam data present  General: well developed, well nourished, pleasant elderly African-American male, seated, in no evident distress Head: head normocephalic and atraumatic.   Neck: supple with no carotid or supraclavicular bruits Cardiovascular: regular rate and rhythm, no murmurs Musculoskeletal: no deformity Skin:  no rash/petichiae Vascular:  Normal pulses all extremities   Neurologic Exam Mental Status: Awake and fully alert.   Fluent speech and language.  Oriented to place and time. Recent and remote memory intact. Attention span, concentration and fund of knowledge appropriate. Mood and affect appropriate.  Cranial Nerves: Fundoscopic exam reveals sharp disc margins. Pupils equal, briskly reactive to light. Extraocular movements full without nystagmus. Visual fields full to confrontation. Hearing intact. Facial sensation intact. Face, tongue, palate moves normally and symmetrically.  Motor: Normal bulk and tone. Normal strength in all tested extremity muscles. Sensory.: intact to touch , pinprick , position and vibratory sensation.  Coordination: Rapid alternating movements normal in all extremities. Finger-to-nose  and heel-to-shin performed accurately bilaterally. Gait and Station: Arises from chair without difficulty. Stance is normal. Gait demonstrates normal stride length and balance Reflexes: 1+ and symmetric. Toes downgoing.       ASSESSMENT/PLAN: Samuel George is a 69 y.o. year old male presented with transient speech difficulty, facial droop and incoordination in setting of recent cognitive decline on 04/14/2020 with stroke work-up revealing left MCA watershed punctate infarcts likely secondary to large vessel disease from severe left ICA stenosis s/p L TCAR. Vascular risk factors include HTN, HLD, carotid stenosis and advanced age.       1. Left MCA stroke:  a. Recovered well without residual deficits.   b. Continue aspirin 81 mg daily and clopidogrel 75 mg daily  and atorvastatin for secondary stroke prevention.   c. Discussed importance of close PCP follow-up for aggressive stroke risk factor management 2. L carotid stenosis s/p TCAR:  a. Repeat carotid ultrasound 04/2020 by vascular surgery patent stent and VS recommended 9 months repeat ultrasound.   b. Remains on DAPT and statin per vascular surgery recommendations.  Discussed prolonged DAPT not indicated from a stroke standpoint and ongoing duration will be determined by vascular surgery. 3. HTN: BP goal<130/90.  Stable.  Managed by advanced HTN clinic 4. HLD: LDL goal<70.  Continuation of atorvastatin.  Management prescribed by PCP.   Overall stable from stroke standpoint and recommend follow-up on an as-needed basis   I spent 30 minutes of face-to-face and non-face-to-face time with patient.  This included previsit chart review, lab review, study review, order entry, electronic health record documentation, patient education and discussion regarding prior stroke, left carotid stenosis s/p TCAR and ongoing use of prescribed medications, importance of managing stroke risk factors and answered all questions to patient  satisfaction    Frann Rider, AGNP-BC  [facility] [address] [place], [place] [address]  Phone [phone] Fax [phone] Note: This document was prepared with digital dictation and possible smart phrase technology. Any transcriptional errors that result from this process are unintentional.

## 2020-08-26 NOTE — Patient Instructions (Signed)
Continue aspirin 81 mg daily and clopidogrel 75 mg daily  and atorvastatin  for secondary stroke prevention  Follow up with Dr. Carlis Abbott in 6 months for repeat carotid ultrasound  Continue to follow up with PCP/HTN clinic regarding cholesterol and blood pressure management  Maintain strict control of hypertension with blood pressure goal below 130/90 and cholesterol with LDL cholesterol (bad cholesterol) goal below 70 mg/dL.       Thank you for coming to see [place] at [facility]. I hope we have been able to provide you high quality care today.  You may receive a patient satisfaction survey over the next few weeks. We would appreciate your feedback and comments so that we may continue to improve ourselves and the health of our patients.   Stroke Prevention Some medical conditions and lifestyle choices can lead to a higher risk for a stroke. You can help to prevent a stroke by making nutrition, lifestyle, and other changes. What nutrition changes can be made?   Eat healthy foods. ? Choose foods that are high in fiber. These include:  Fresh fruits.  Fresh vegetables.  Whole grains. ? Eat at least 5 or more servings of fruits and vegetables each day. Try to fill half of your plate at each meal with fruits and vegetables. ? Choose lean protein foods. These include:  Lowfat (lean) cuts of meat.  Chicken without skin.  Fish.  Tofu.  Beans.  Nuts. ? Eat low-fat dairy products. ? Avoid foods that:  Are high in salt (sodium).  Have saturated fat.  Have trans fat.  Have cholesterol.  Are processed.  Are premade.  Follow eating guidelines as told by your doctor. These may include: ? Reducing how many calories you eat and drink each day. ? Limiting how much salt you eat or drink each day to 1,500 milligrams (mg). ? Using only healthy fats for cooking. These include:  Olive oil.  Canola oil.  Sunflower oil. ? Counting how many carbohydrates you eat  and drink each day. What lifestyle changes can be made?  Try to stay at a healthy weight. Talk to your doctor about what a good weight is for you.  Get at least 30 minutes of moderate physical activity at least 5 days a week. This can include: ? Fast walking. ? Biking. ? Swimming.  Do not use any products that have nicotine or tobacco. This includes cigarettes and e-cigarettes. If you need help quitting, ask your doctor. Avoid being around tobacco smoke in general.  Limit how much alcohol you drink to no more than 1 drink a day for nonpregnant women and 2 drinks a day for men. One drink equals 12 oz of beer, 5 oz of wine, or 1 oz of hard liquor.  Do not use drugs.  Avoid taking birth control pills. Talk to your doctor about the risks of taking birth control pills if: ? You are over 41 years old. ? You smoke. ? You get migraines. ? You have had a blood clot. What other changes can be made?  Manage your cholesterol. ? It is important to eat a healthy diet. ? If your cholesterol cannot be managed through your diet, you may also need to take medicines. Take medicines as told by your doctor.  Manage your diabetes. ? It is important to eat a healthy diet and to exercise regularly. ? If your blood sugar cannot be managed through diet and exercise, you may need to take medicines. Take medicines as told  by your doctor.  Control your high blood pressure (hypertension). ? Try to keep your blood pressure below 130/80. This can help lower your risk of stroke. ? It is important to eat a healthy diet and to exercise regularly. ? If your blood pressure cannot be managed through diet and exercise, you may need to take medicines. Take medicines as told by your doctor. ? Ask your doctor if you should check your blood pressure at home. ? Have your blood pressure checked every year. Do this even if your blood pressure is normal.  Talk to your doctor about getting checked for a sleep disorder. Signs  of this can include: ? Snoring a lot. ? Feeling very tired.  Take over-the-counter and prescription medicines only as told by your doctor. These may include aspirin or blood thinners (antiplatelets or anticoagulants).  Make sure that any other medical conditions you have are managed. Where to find more information  American Stroke Association: www.strokeassociation.org  National Stroke Association: www.stroke.org Get help right away if:  You have any symptoms of stroke. "BE FAST" is an easy way to remember the main warning signs: ? B - Balance. Signs are dizziness, sudden trouble walking, or loss of balance. ? E - Eyes. Signs are trouble seeing or a sudden change in how you see. ? F - Face. Signs are sudden weakness or loss of feeling of the face, or the face or eyelid drooping on one side. ? A - Arms. Signs are weakness or loss of feeling in an arm. This happens suddenly and usually on one side of the body. ? S - Speech. Signs are sudden trouble speaking, slurred speech, or trouble understanding what people say. ? T - Time. Time to call emergency services. Write down what time symptoms started.  You have other signs of stroke, such as: ? A sudden, very bad headache with no known cause. ? Feeling sick to your stomach (nausea). ? Throwing up (vomiting). ? Jerky movements you cannot control (seizure). These symptoms may represent a serious problem that is an emergency. Do not wait to see if the symptoms will go away. Get medical help right away. Call your local emergency services (911 in the [place] Do not drive yourself to the hospital. Summary  You can prevent a stroke by eating healthy, exercising, not smoking, drinking less alcohol, and treating other health problems, such as diabetes, high blood pressure, or high cholesterol.  Do not use any products that contain nicotine or tobacco, such as cigarettes and e-cigarettes.  Get help right away if you have any signs or symptoms of a  stroke. This information is not intended to replace advice given to you by your health care provider. Make sure you discuss any questions you have with your health care provider. Document Revised: 01/12/2019 Document Reviewed: 02/17/2017 Elsevier Patient Education  [place].

## 2020-09-04 ENCOUNTER — Ambulatory Visit: Payer: Medicare Other | Admitting: Cardiovascular Disease

## 2020-09-05 ENCOUNTER — Ambulatory Visit (INDEPENDENT_AMBULATORY_CARE_PROVIDER_SITE_OTHER): Payer: Medicare Other | Admitting: Cardiovascular Disease

## 2020-09-05 ENCOUNTER — Other Ambulatory Visit: Payer: Self-pay

## 2020-09-05 ENCOUNTER — Encounter: Payer: Self-pay | Admitting: Cardiovascular Disease

## 2020-09-05 VITALS — BP 126/80 | HR 40 | Ht 71.0 in | Wt 225.0 lb

## 2020-09-05 DIAGNOSIS — I493 Ventricular premature depolarization: Secondary | ICD-10-CM

## 2020-09-05 DIAGNOSIS — I6522 Occlusion and stenosis of left carotid artery: Secondary | ICD-10-CM

## 2020-09-05 DIAGNOSIS — I10 Essential (primary) hypertension: Secondary | ICD-10-CM | POA: Diagnosis not present

## 2020-09-05 MED ORDER — NEBIVOLOL HCL 10 MG PO TABS: 10.0000 mg | ORAL_TABLET | Freq: Every day | ORAL | 1 refills | Status: AC

## 2020-09-05 MED ORDER — AMLODIPINE BESYLATE 10 MG PO TABS: 10.0000 mg | ORAL_TABLET | Freq: Every day | ORAL | 1 refills | Status: AC

## 2020-09-05 MED ORDER — IRBESARTAN-HYDROCHLOROTHIAZIDE 150-12.5 MG PO TABS: 1.0000 | ORAL_TABLET | Freq: Every day | ORAL | 1 refills | Status: DC

## 2020-09-05 MED ORDER — ATORVASTATIN CALCIUM 40 MG PO TABS: 40.0000 mg | ORAL_TABLET | Freq: Every day | ORAL | 1 refills | Status: DC

## 2020-09-05 MED ORDER — ASPIRIN 81 MG PO CHEW: 81.0000 mg | CHEWABLE_TABLET | Freq: Every day | ORAL | 1 refills | Status: AC

## 2020-09-05 MED ORDER — DOXAZOSIN MESYLATE 4 MG PO TABS: 4.0000 mg | ORAL_TABLET | Freq: Every day | ORAL | 1 refills | Status: AC

## 2020-09-05 MED ORDER — CLOPIDOGREL BISULFATE 75 MG PO TABS: 75.0000 mg | ORAL_TABLET | Freq: Every day | ORAL | 1 refills | Status: AC

## 2020-09-05 MED ORDER — SPIRONOLACTONE 25 MG PO TABS
25.0000 mg | ORAL_TABLET | Freq: Every day | ORAL | 1 refills | Status: DC
Start: 2020-09-05 — End: 2020-10-31

## 2020-09-05 NOTE — Patient Instructions (Signed)
Medication Instructions:  Your physician recommends that you continue on your current medications as directed. Please refer to the Current Medication list given to you today.   Labwork: NONE   Testing/Procedures: NONE   Follow-Up: Your physician wants you to follow-up in: 6 MONTHS  You will receive a reminder letter in the mail two months in advance. If you don't receive a letter, please call our office to schedule the follow-up appointment.  

## 2020-09-05 NOTE — Progress Notes (Signed)
Hypertension Clinic Follow up:    Date:  09/05/2020   ID:  Samuel George, DOB 1951/07/24, MRN [id number]  PCP:  London Pepper, MD  Cardiologist:  Evalina Field, MD  Nephrologist:  Referring MD: London Pepper, MD   CC: Hypertension  History of Present Illness:    Samuel George is a 69 y.o. male with a hx of stroke, carotid stenosis, mild renal artery stenosis, hypertension and PVCs here to establish care in the hypertension clinic.  He was first diagnosed with hypertension December 2020.  Prior to that he was never on medications.  He has struggled to get his blood pressure under control.  He last saw [name] on 03/08/20 and his blood pressure remained uncontrolled on amlodipine, irbesartan, and carvedilol.  He developed some erectile dysfunction so carvedilol was reduced and irbesartan was increased.  Hydrochlorothiazide was also added to his regimen.  There was some concern for an underlying diagnosis of hyperaldosteronism.  He complains that the medications make him dizzy, listless and hard for him to focus.  He is unsure if reducing carvedilol helped his symptoms he mostly cooks at home and does not add salt to his food.  He has not been exercising much lately due to the pandemic.  Previously he exercised for about an hour daily and had no exertional chest pain or shortness of breath.  Lately it has been about 15 minutes when he does exercise.  He has not had any caffeine since 1977.  He takes a multivitamin but no other over-the-counter medications or supplements.  He denies any chest pain, shortness of breath, lower extreme edema, orthopnea, or PND.  At his initial assessment carvedilol was switched to nebivolol due to intolerance.  Spironolactone was also added to his regimen.  He was referred to the prep [employer] program.  However he has been exercising for 60 to 90 minutes daily on his own.  He has no exertional chest pain or shortness of breath.  He has not had any lower extremity edema,  orthopnea, or PND.  He was also enrolled in our remote patient monitoring program.  He last our pharmacist on 07/2020 and was doing well.  His blood pressure was averaging 130/64 in the remote patient monitoring up.  He was also referred for renal artery Dopplers that revealed bilateral renal cysts and an abnormal resistive index bilaterally.  He had 1 to 59% stenosis bilaterally and there was a question of fibromuscular dysplasia in both arteries.  Since his appointment he had a stroke 03/2020 and was found to have critical L carotid stenosis.   He underwent TCAR and was started on Plavix and atorvastatin.  He saw Dr. Fletcher Anon who recommended that given that his blood pressures were now well have been controlled they would continue to monitor him annually.  He continues to check his blood pressure daily and it is consistently less than 130/80.  He notes some intermittent discomfort in his right arm.  It is a sharp sensation that does not occur with any particular movements.  He does note that sometimes he has limited ability to abduct his arm.  It is not present when he is running.  He notes that overall his diet is quite good.  He but plans to go on a cruise ship around the road for the next 6 months.  He plans to be out of the country for the next six months.     Previous antihypertensives: Lisinopril    Past Medical History:  Diagnosis Date  . Hypertension     Past Surgical History:  Procedure Laterality Date  . TRANSCAROTID ARTERY REVASCULARIZATION Left 04/18/2020   Procedure: TRANSCAROTID ARTERY REVASCULARIZATION LEFT;  Surgeon: Marty Heck, MD;  Location: [facility] OR;  Service: Vascular;  Laterality: Left;    Current Medications: Current Meds  Medication Sig  . amLODipine (NORVASC) 10 MG tablet Take 1 tablet (10 mg total) by mouth daily.  [name] aspirin 81 MG chewable tablet Chew 1 tablet (81 mg total) by mouth daily.  [name] atorvastatin (LIPITOR) 40 MG tablet Take 1 tablet (40 mg total) by  mouth daily.  . clopidogrel (PLAVIX) 75 MG tablet Take 1 tablet (75 mg total) by mouth daily.  [name] doxazosin (CARDURA) 4 MG tablet Take 1 tablet (4 mg total) by mouth daily.  . irbesartan-hydrochlorothiazide (AVALIDE) 150-12.5 MG tablet Take 1 tablet by mouth daily.  . Multiple Vitamin (MULTIVITAMIN WITH MINERALS) TABS tablet Take 1 tablet by mouth daily.  . nebivolol (BYSTOLIC) 10 MG tablet Take 1 tablet (10 mg total) by mouth daily.  . sildenafil (VIAGRA) 50 MG tablet Take 50 mg by mouth daily as needed for erectile dysfunction. Take one tablet as needed for erectile dysfunction  . spironolactone (ALDACTONE) 25 MG tablet Take 1 tablet (25 mg total) by mouth daily.  . [DISCONTINUED] amLODipine (NORVASC) 10 MG tablet Take 1 tablet (10 mg total) by mouth daily.  . [DISCONTINUED] aspirin 81 MG chewable tablet Chew by mouth daily.  . [DISCONTINUED] atorvastatin (LIPITOR) 40 MG tablet Take 1 tablet (40 mg total) by mouth daily.  . [DISCONTINUED] clopidogrel (PLAVIX) 75 MG tablet Take 1 tablet (75 mg total) by mouth daily.  . [DISCONTINUED] doxazosin (CARDURA) 4 MG tablet Take 1 tablet (4 mg total) by mouth daily.  . [DISCONTINUED] irbesartan-hydrochlorothiazide (AVALIDE) 150-12.5 MG tablet Take 1 tablet by mouth daily.  . [DISCONTINUED] nebivolol (BYSTOLIC) 10 MG tablet Take 1 tablet (10 mg total) by mouth daily.  . [DISCONTINUED] spironolactone (ALDACTONE) 25 MG tablet Take 1 tablet (25 mg total) by mouth daily. Take 1 tablet Once Daily     Allergies:   Patient has no known allergies.   Social History   Socioeconomic History  . Marital status: Married    Spouse name: Not on file  . Number of children: Not on file  . Years of education: Not on file  . Highest education level: Not on file  Occupational History  . Not on file  Tobacco Use  . Smoking status: Never Smoker  . Smokeless tobacco: Never Used  Vaping Use  . Vaping Use: Never used  Substance and Sexual Activity  . Alcohol use:  Not on file  . Drug use: Not on file  . Sexual activity: Not on file  Other Topics Concern  . Not on file  Social History Narrative  . Not on file   Social Determinants of Health   Financial Resource Strain:   . Difficulty of Paying Living Expenses: Not on file  Food Insecurity: No Food Insecurity  . Worried About Charity fundraiser in the Last Year: Never true  . Ran Out of Food in the Last Year: Never true  Transportation Needs: No Transportation Needs  . Lack of Transportation (Medical): No  . Lack of Transportation (Non-Medical): No  Physical Activity: Insufficiently Active  . Days of Exercise per Week: 3 days  . Minutes of Exercise per Session: 30 min  Stress: No Stress Concern Present  . Feeling of Stress : Not  at all  Social Connections:   . Frequency of Communication with Friends and Family: Not on file  . Frequency of Social Gatherings with Friends and Family: Not on file  . Attends Religious Services: Not on file  . Active Member of Clubs or Organizations: Not on file  . Attends Archivist Meetings: Not on file  . Marital Status: Not on file     Family History: The patient's family history includes Hypertension in his mother.  ROS:   Please see the history of present illness.    All other systems reviewed and are negative.  EKGs/Labs/Other Studies Reviewed:    EKG:  EKG is not ordered today.  The ekg ordered  demonstrates sinus rhythm.  Frequent PVCs.  Recent Labs: 04/14/2020: ALT 25 04/19/2020: Hemoglobin 11.6; Platelets 233 06/27/2020: BUN 19; Creatinine, Ser 1.28; Potassium 4.5; Sodium 140   Recent Lipid Panel    Component Value Date/Time   CHOL 186 04/15/2020 0311   TRIG 47 04/15/2020 0311   HDL 68 04/15/2020 0311   CHOLHDL 2.7 04/15/2020 0311   VLDL 9 04/15/2020 0311   LDLCALC 109 (H) 04/15/2020 0311    Physical Exam:    VS:  BP 126/80   Pulse (!) 40   Ht 5\' 11"  (1.803 m)   Wt 225 lb (102.1 kg)   SpO2 99%   BMI 31.38 kg/m      Wt Readings from Last 3 Encounters:  09/05/20 225 lb (102.1 kg)  08/26/20 227 lb (103 kg)  08/01/20 227 lb 3.2 oz (103.1 kg)     VS:  BP 126/80   Pulse (!) 40   Ht 5\' 11"  (1.803 m)   Wt 225 lb (102.1 kg)   SpO2 99%   BMI 31.38 kg/m  , BMI Body mass index is 31.38 kg/m. GENERAL:  Well appearing HEENT: Pupils equal round and reactive, fundi not visualized, oral mucosa unremarkable NECK:  No jugular venous distention, waveform within normal limits, carotid upstroke brisk and symmetric, no bruits LUNGS:  Clear to auscultation bilaterally HEART:  RRR with occasional ectopy.  PMI not displaced or sustained,S1 and S2 within normal limits, no S3, no S4, no clicks, no rubs, no murmurs ABD:  Flat, positive bowel sounds normal in frequency in pitch, no bruits, no rebound, no guarding, no midline pulsatile mass, no hepatomegaly, no splenomegaly EXT:  2 plus pulses throughout, no edema, no cyanosis no clubbing SKIN:  No rashes no nodules NEURO:  Cranial nerves II through XII grossly intact, motor grossly intact throughout PSYCH:  Cognitively intact, oriented to person place and time    ASSESSMENT:    1. Essential hypertension   2. PVC's (premature ventricular contractions)   3. Left carotid stenosis     PLAN:    # Essential hypertension:  Mr. Memmott blood pressure has been much better controlled.  He is stable on his current regimen.  He does have mild bilateral renal artery stenosis and will follow-up annually with Dr. Fletcher Anon.  He continues to exercise regularly and his diet has been quite good.  Continue amlodipine, doxazosin, irbesartan, hydrochlorothiazide, nebivolol, and spironolactone.  # PVCs: Stable.  Denies palpitations.    Disposition:    FU with Jahking Lesser C. Oval Linsey, MD, [facility] in 6 months.    Medication Adjustments/Labs and Tests Ordered: Current medicines are reviewed at length with the patient today.  Concerns regarding medicines are outlined above.  No orders of  the defined types were placed in this encounter.  Meds ordered  this encounter  Medications  . amLODipine (NORVASC) 10 MG tablet    Sig: Take 1 tablet (10 mg total) by mouth daily.    Dispense:  180 tablet    Refill:  1    Patient will need 6 month supply while traveling  . doxazosin (CARDURA) 4 MG tablet    Sig: Take 1 tablet (4 mg total) by mouth daily.    Dispense:  180 tablet    Refill:  1    PATIENT NEEDS 6 MONTHS WORTH, GOING OUT OF THE COUNTRY  . irbesartan-hydrochlorothiazide (AVALIDE) 150-12.5 MG tablet    Sig: Take 1 tablet by mouth daily.    Dispense:  180 tablet    Refill:  1    PATIENT NEEDS 6 MONTHS WORTH, GOING OUT OF THE COUNTRY  . nebivolol (BYSTOLIC) 10 MG tablet    Sig: Take 1 tablet (10 mg total) by mouth daily.    Dispense:  180 tablet    Refill:  1    PATIENT NEEDS 6 MONTHS WORTH, GOING OUT OF THE COUNTRY  . spironolactone (ALDACTONE) 25 MG tablet    Sig: Take 1 tablet (25 mg total) by mouth daily.    Dispense:  180 tablet    Refill:  1    PATIENT NEEDS 6 MONTHS WORTH, GOING OUT OF THE COUNTRY  . aspirin 81 MG chewable tablet    Sig: Chew 1 tablet (81 mg total) by mouth daily.    Dispense:  180 tablet    Refill:  1    PATIENT NEEDS 6 MONTHS WORTH, GOING OUT OF THE COUNTRY  . atorvastatin (LIPITOR) 40 MG tablet    Sig: Take 1 tablet (40 mg total) by mouth daily.    Dispense:  180 tablet    Refill:  1    PATIENT NEEDS 6 MONTHS WORTH, GOING OUT OF THE COUNTRY  . clopidogrel (PLAVIX) 75 MG tablet    Sig: Take 1 tablet (75 mg total) by mouth daily.    Dispense:  180 tablet    Refill:  1    PATIENT NEEDS 6 MONTHS WORTH, GOING OUT OF THE COUNTRY     Signed, Skeet Latch, MD  09/05/2020 5:17 PM    [facility]

## 2020-10-04 ENCOUNTER — Telehealth: Payer: Self-pay | Admitting: Cardiovascular Disease

## 2020-10-04 NOTE — Telephone Encounter (Signed)
OK that makes sense.  I suspect his BP will now be elevated.  If is BP is running >130/80, recommend increasing doxazosin to 4mg  bid.  He will need f/u within a month.  Can be with APP.

## 2020-10-04 NOTE — Telephone Encounter (Signed)
Samuel George with Dr. Arnetha Courser office states the patient recently had lab work and they would like to provide an update for Dr. Oval Linsey. Samuel George reported the patient's creatinine level is at 2.49 and patient had a BUN of 61.   Per Samuel George, Dr. Orland Mustard advised that the patient temporarily reduce both irbesartan-hydrochlorothiazide (AVALIDE) 150-12.5 MG tablet and spironolactone (ALDACTONE) 25 MG tablet from 1 tablet to half a tablet daily. Dr. Orland Mustard is inquiring about whether Dr. Oval Linsey has any further recommendations. Please advise.  Phone number: [phone]

## 2020-10-04 NOTE — Telephone Encounter (Signed)
Spoke with MD's office and provided recommendations from Dr. Oval Linsey.   Spoke with patient. Last BP readings have been 125/67, 118/69, 139/68, 116/57, 131/69, 128/68, 129//69.   Patient placed on the schedule for 10/31/20. Patient aware to call back for instructions if blood pressures start to read greater than 130/80. No further questions. Patient verbalized understanding.

## 2020-10-10 IMAGING — CT CT ANGIO NECK
2 of 13 series · 11 of 46 positions shown, 15 images · IV contrast (omnipaque)
Comparison: None.

CLINICAL DATA: Transient right facial droop, aphasia, and ataxia.

EXAM:
CT ANGIOGRAPHY HEAD AND NECK
TECHNIQUE: Multidetector CT imaging of the head and neck was performed using
the standard protocol during bolus administration of intravenous
contrast. Multiplanar CT image reconstructions and MIPs were
obtained to evaluate the vascular anatomy. Carotid stenosis
measurements (when applicable) are obtained utilizing NASCET
criteria, using the distal internal carotid diameter as the
denominator.
CONTRAST:  75mL OMNIPAQUE IOHEXOL 350 MG/ML SOLN

[Series 15: coronal mpr · coronal · 0.47mm/px · 1 of 147 slices shown, 2 images]
[im 74/147  soft-tissue]
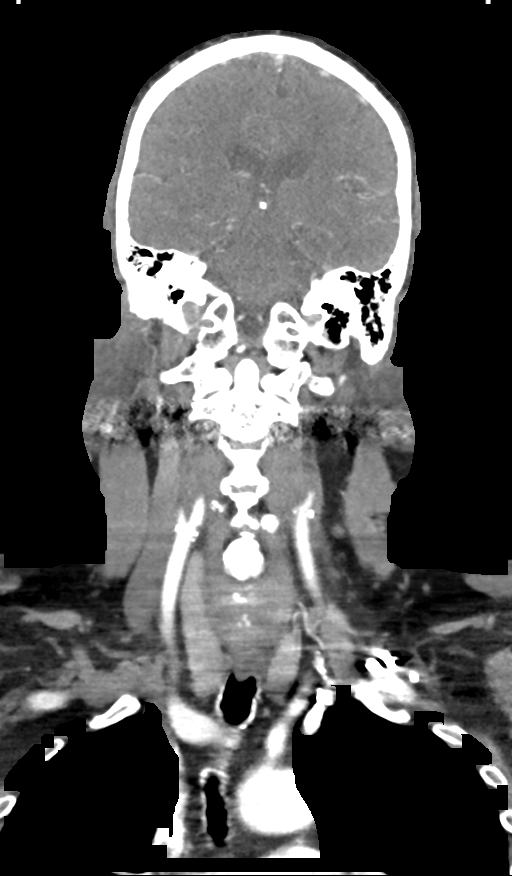
[im 74/147  bone]
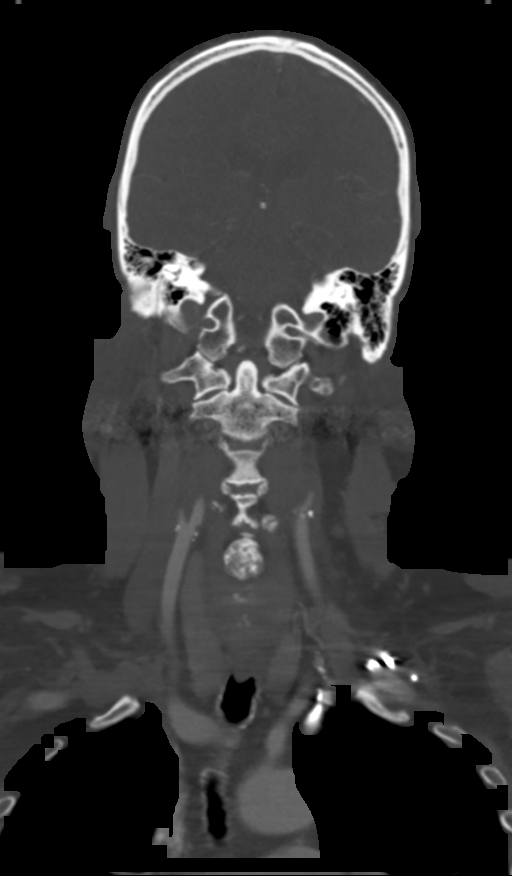

[Series 17: thin · axial · 0.50mm/px · z∈[-384,-23]mm · 10 of 827 slices shown, 13 images]
[im 52/827  soft-tissue]
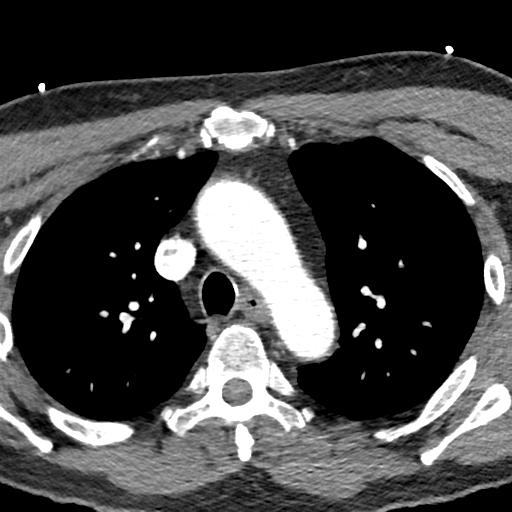
[im 52/827  bone]
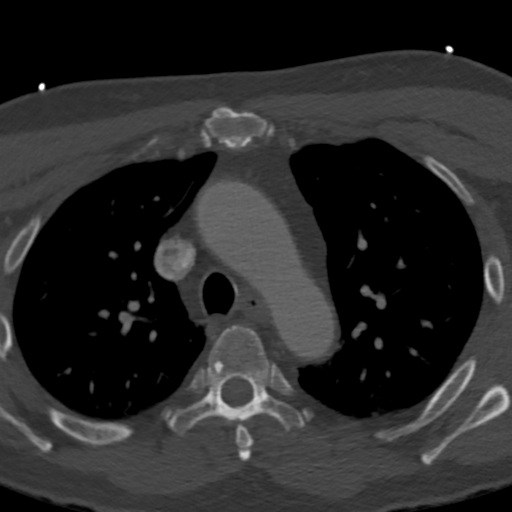
[im 155/827  soft-tissue]
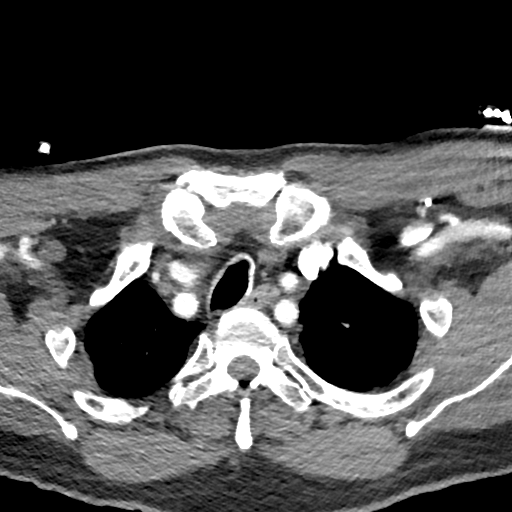
[im 259/827  soft-tissue]
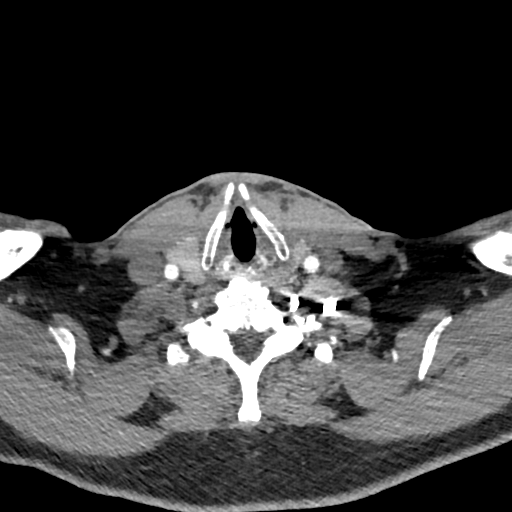
[im 362/827  soft-tissue]
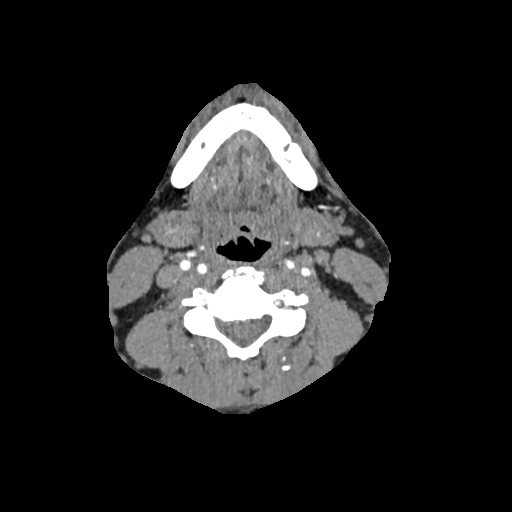
[im 465/827  soft-tissue]
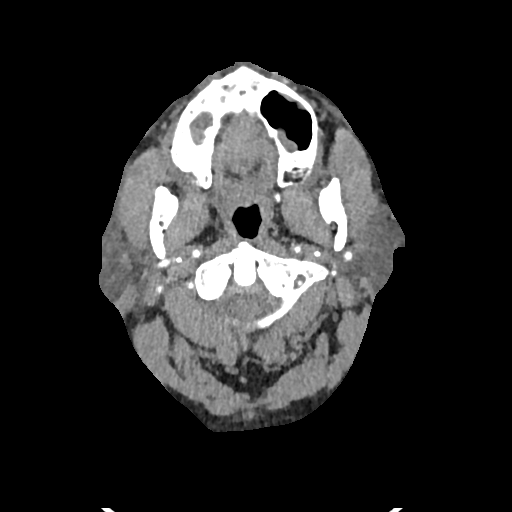
[im 568/827  soft-tissue]
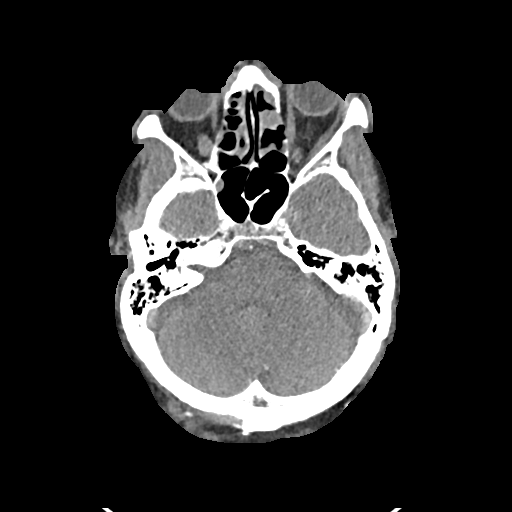
[im 620/827  lung]
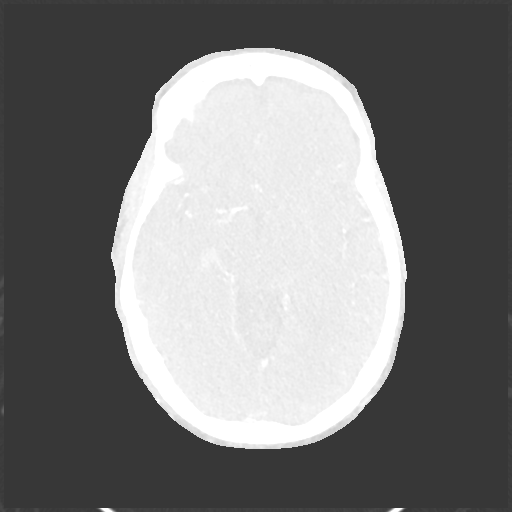
[im 672/827  soft-tissue]
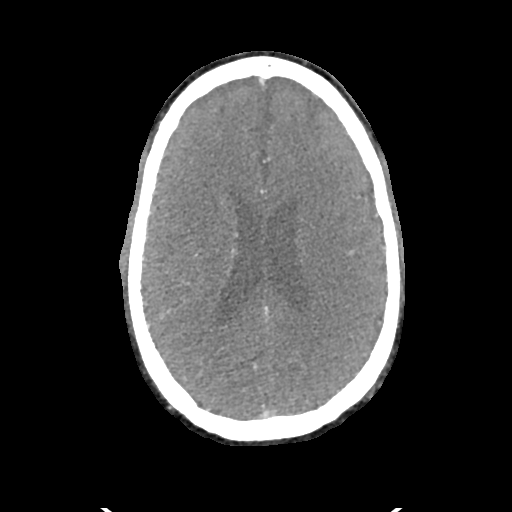
[im 672/827  lung]
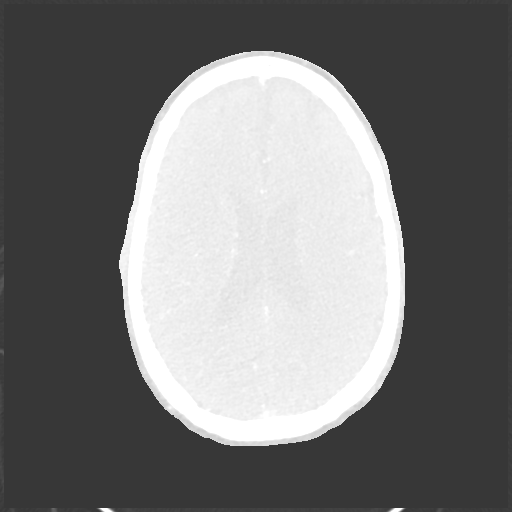
[im 723/827  lung]
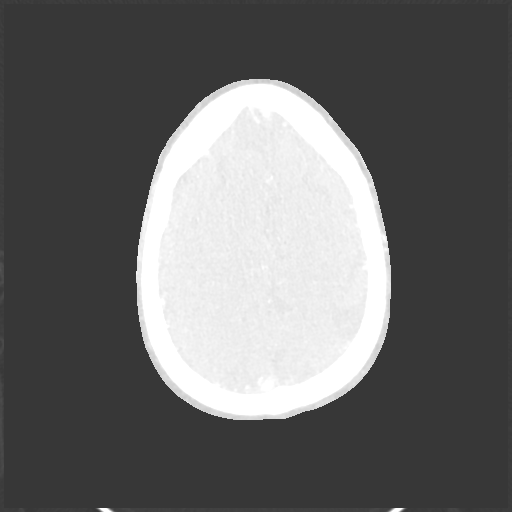
[im 775/827  soft-tissue]
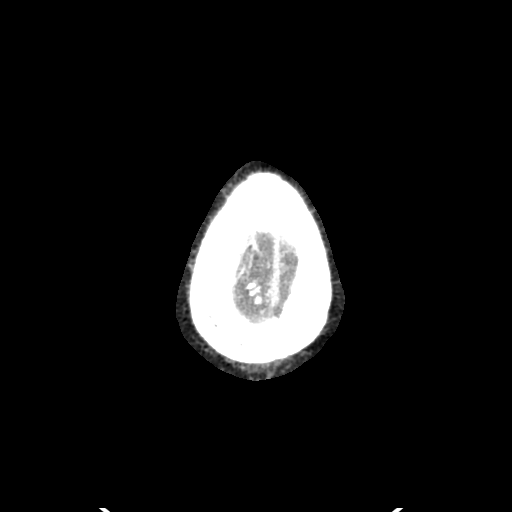
[im 775/827  lung]
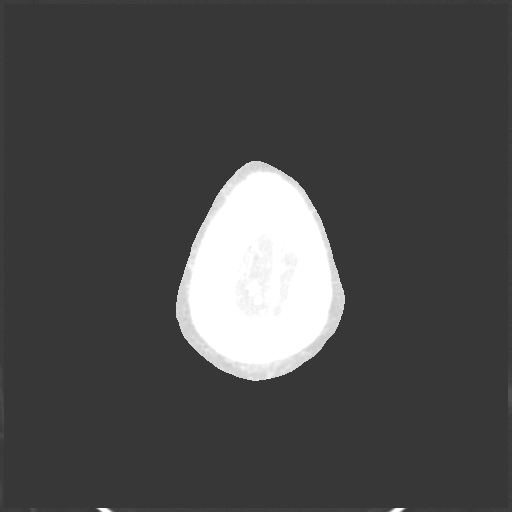

[11 of 46 positions shown; findings below may reference images not displayed]

FINDINGS: CT HEAD FINDINGS

Brain: No acute cortically based infarct, intracranial hemorrhage,
mass, midline shift, or extra-axial fluid collection is identified.
There is asymmetric hypoattenuation in the left frontoparietal white
matter at the level of the centrum semiovale and corona radiata with
several subcentimeter discrete hypodense foci compatible with small
infarcts of indeterminate age.

Vascular: Calcified atherosclerosis at the skull base. No hyperdense
vessel.

Skull: No fracture or suspicious osseous lesion.

Sinuses: Moderate, partially polypoid mucosal thickening in the
paranasal sinuses. Clear mastoid air cells.

Orbits: Unremarkable.

Review of the MIP images confirms the above findings

CTA NECK FINDINGS

Aortic arch: Standard 3 vessel aortic arch with widely patent arch
vessel origins.

Right carotid system: Patent with mild, predominantly calcified
plaque at the carotid bifurcation. No evidence of significant
stenosis or dissection.

Left carotid system: Widely patent common carotid artery. Calcified
and soft plaque at the carotid bifurcation. Soft plaque in the
proximal ICA resulting in a nearly occlusive stenosis (radiographic
string sign). Patent but diffusely small cervical ICA more distally
consistent with reduced flow.

Vertebral arteries: Patent and small bilateral. No significant
stenosis or dissection identified although venous contrast limits
assessment of the left V1 segment.

Skeleton: Cervical disc degeneration, severe at C5-6. Possible
central disc protrusion and moderate to severe spinal stenosis at
C6-7.

Other neck: No evidence of cervical lymphadenopathy or mass.

Upper chest: Clear lung apices.

Review of the MIP images confirms the above findings

CTA HEAD FINDINGS

Anterior circulation: The internal carotid arteries are patent from
skull base to carotid termini without significant stenosis on the
right. Calcified plaque results in mild proximal right supraclinoid
stenosis. The left A1 segment is hypoplastic. ACAs and MCAs are
patent without evidence of a significant A1 or M1 stenosis. The
branch vessels are suboptimally evaluated due to image noise
resulting in a diffusely irregular and attenuated appearance which
is mildly greater involving the left MCA branches, however no
convincing proximal branch occlusion is identified. No aneurysm is
identified.

Posterior circulation: The intracranial vertebral arteries are
patent to the basilar. Patent left PICA, right AICA, and bilateral
SCA origins are identified. The basilar artery is patent and
congenitally small in caliber diffusely. There are fetal origins of
the PCAs. Both PCAs are patent without evidence of a flow limiting
proximal stenosis. No aneurysm is identified.

Venous sinuses: Not well evaluated due to contrast timing.

Anatomic variants: Hypoplastic vertebrobasilar circulation and left
A1 segment.

Review of the MIP images confirms the above findings
IMPRESSION: 1. Critical proximal left ICA stenosis (radiographic string sign)
with age indeterminate infarcts in the deep left cerebral white
matter (watershed ischemia).
2. Mild proximal left supraclinoid ICA stenosis.
3. Hypoplastic vertebrobasilar circulation.
4. Aortic Atherosclerosis ([id number].O).

## 2020-10-10 IMAGING — DX DG CHEST 2V
2 series · 2 of 2 positions shown · non-contrast
Comparison: None.

CLINICAL DATA: Stroke.

EXAM:
CHEST - 2 VIEW

[chest pa]
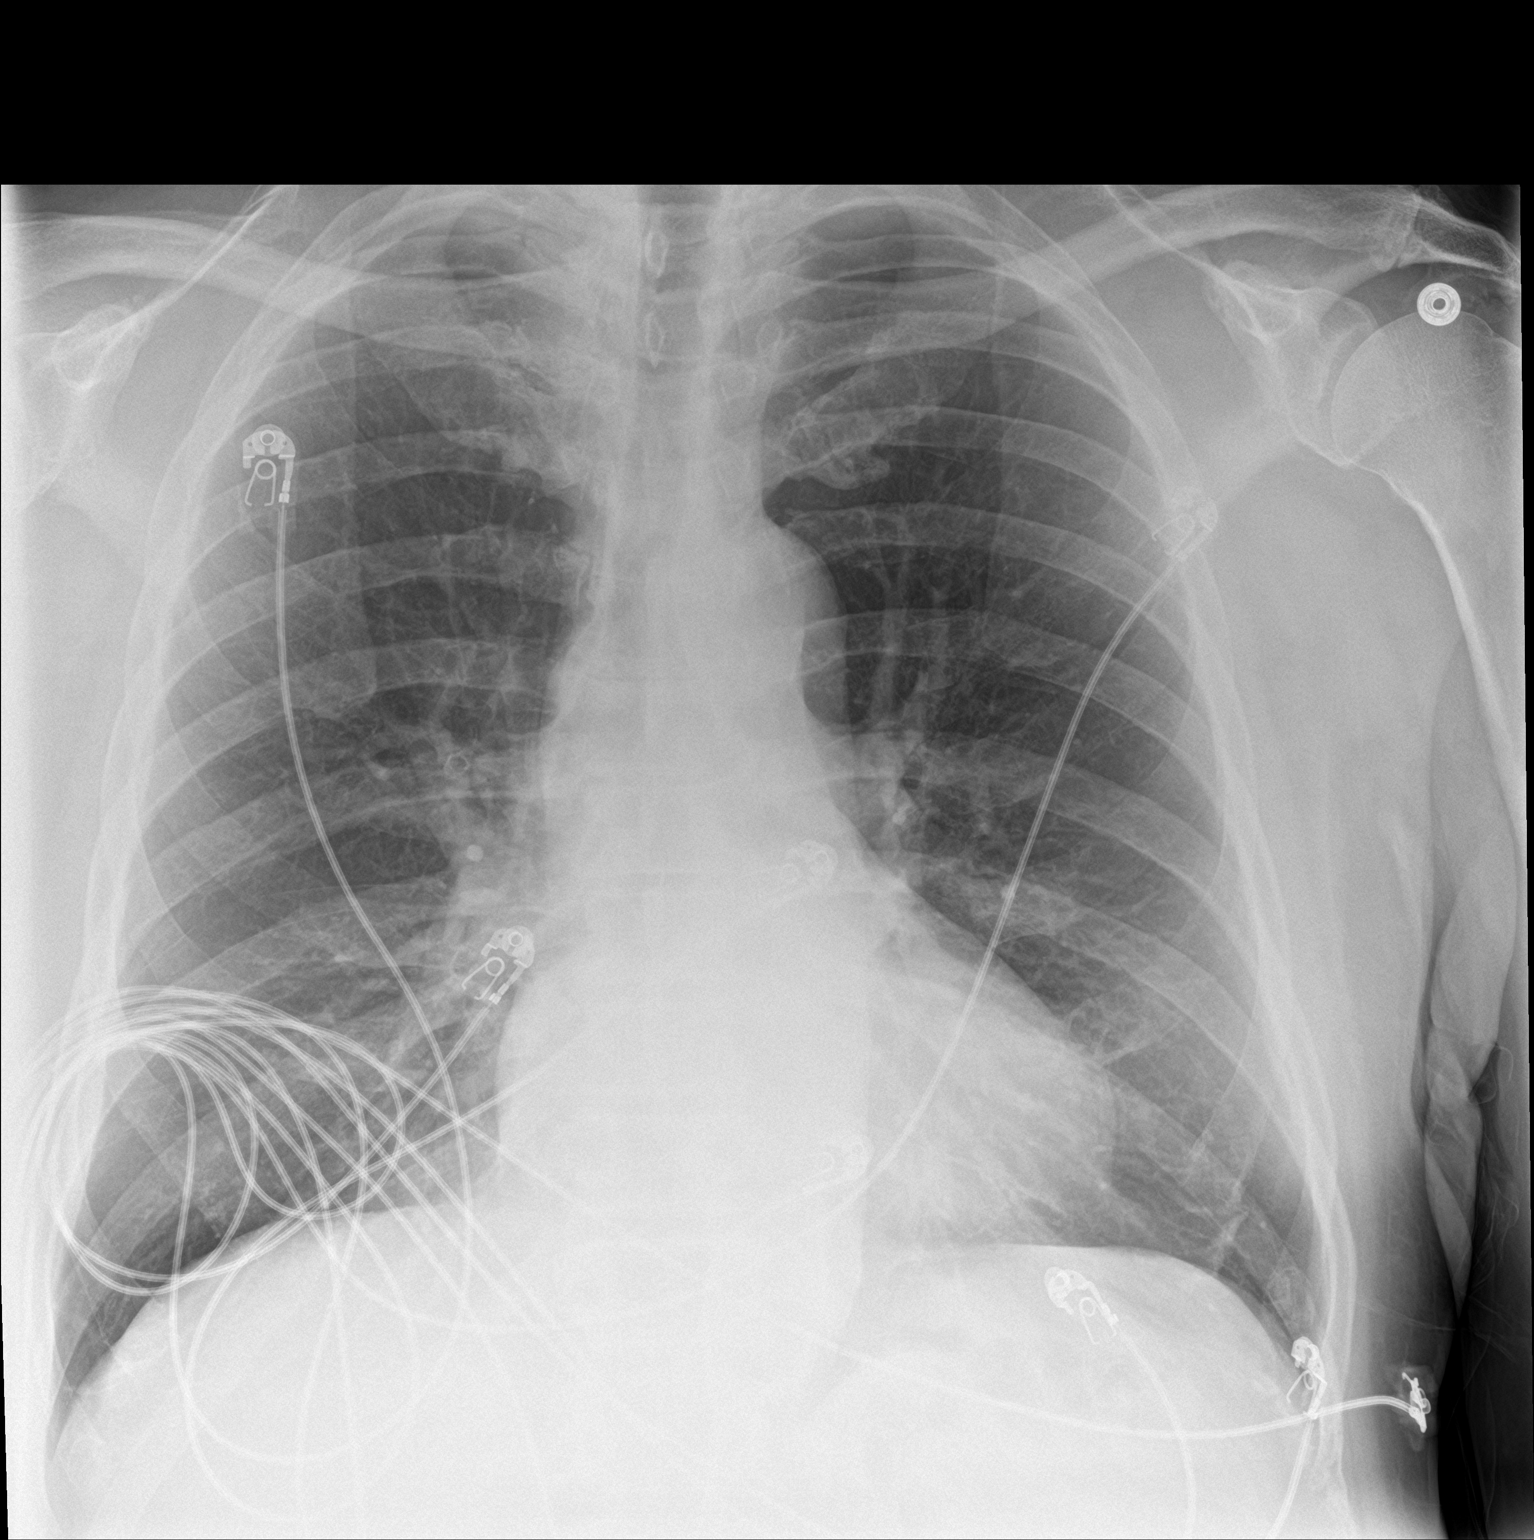

[chest lat]
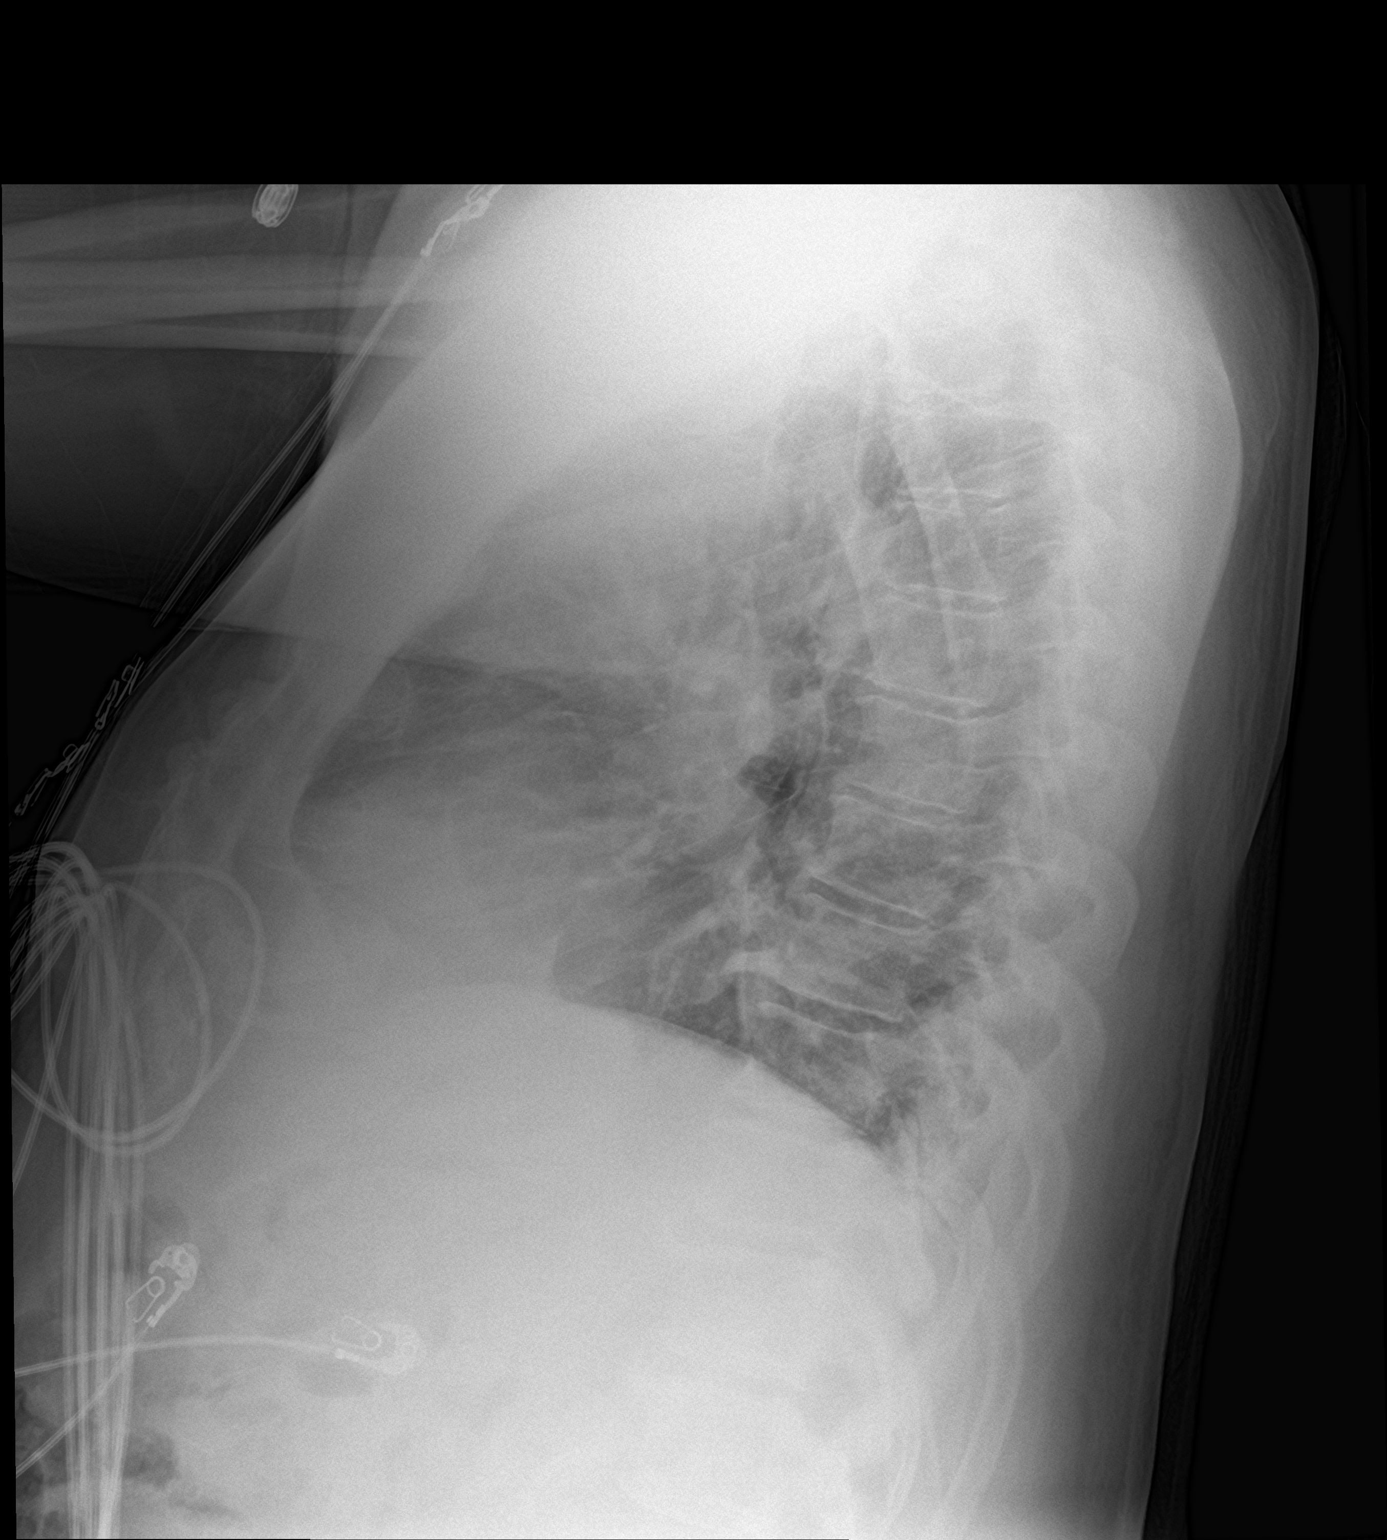

[2 of 2 positions shown; findings below may reference images not displayed]

FINDINGS: The cardiomediastinal contours are normal. The lungs are clear.
Pulmonary vasculature is normal. No consolidation, pleural effusion,
or pneumothorax. No acute osseous abnormalities are seen. Mild
degenerative change in the spine.
IMPRESSION: No acute chest findings.

## 2020-10-22 ENCOUNTER — Telehealth: Payer: Self-pay

## 2020-10-22 DIAGNOSIS — Z0000 Encounter for general adult medical examination without abnormal findings: Secondary | ICD-10-CM

## 2020-10-22 NOTE — Telephone Encounter (Signed)
Called patient to inform them that they have successfully completed the [employer] 16-week program and to keep a paper log of their BP to bring to follow up appointments. Left patient a message to return call to Care Guide at [phone].

## 2020-10-31 ENCOUNTER — Other Ambulatory Visit: Payer: Self-pay

## 2020-10-31 ENCOUNTER — Ambulatory Visit: Payer: Medicare Other | Admitting: Cardiovascular Disease

## 2020-10-31 ENCOUNTER — Encounter: Payer: Self-pay | Admitting: Cardiovascular Disease

## 2020-10-31 VITALS — BP 141/73 | HR 86 | Temp 96.3°F | Ht 72.0 in | Wt 225.2 lb

## 2020-10-31 DIAGNOSIS — I10 Essential (primary) hypertension: Secondary | ICD-10-CM | POA: Diagnosis not present

## 2020-10-31 DIAGNOSIS — I639 Cerebral infarction, unspecified: Secondary | ICD-10-CM | POA: Diagnosis not present

## 2020-10-31 DIAGNOSIS — I493 Ventricular premature depolarization: Secondary | ICD-10-CM

## 2020-10-31 DIAGNOSIS — N1831 Chronic kidney disease, stage 3a: Secondary | ICD-10-CM | POA: Diagnosis not present

## 2020-10-31 NOTE — Patient Instructions (Signed)
Medication Instructions:  ?Your physician recommends that you continue on your current medications as directed. Please refer to the Current Medication list given to you today.  ? ?Labwork: ?NONE ? ?Testing/Procedures: ?NONE ? ?Follow-Up: ?AS NEEDED  ? ?  ?

## 2020-10-31 NOTE — Progress Notes (Signed)
Hypertension Clinic Follow up:    Date:  10/31/2020   ID:  Samuel George, DOB [birth year], MRN [id number]  PCP:  London Pepper, MD  Cardiologist:  Evalina Field, MD  Nephrologist:  Referring MD: London Pepper, MD   CC: Hypertension  History of Present Illness:    Samuel George is a 69 y.o. male with a hx of stroke, carotid stenosis, mild renal artery stenosis, hypertension and PVCs here to establish care in the hypertension clinic.  He was first diagnosed with hypertension December 2020.  Prior to that he was never on medications.  He has struggled to get his blood pressure under control.  He last saw [name] on 03/08/20 and his blood pressure remained uncontrolled on amlodipine, irbesartan, and carvedilol.  He developed some erectile dysfunction so carvedilol was reduced and irbesartan was increased.  Hydrochlorothiazide was also added to his regimen.  There was some concern for an underlying diagnosis of hyperaldosteronism.  He complains that the medications make him dizzy, listless and hard for him to focus.  He is unsure if reducing carvedilol helped his symptoms he mostly cooks at home and does not add salt to his food.  He has not been exercising much lately due to the pandemic.  Previously he exercised for about an hour daily and had no exertional chest pain or shortness of breath.  Lately it has been about 15 minutes when he does exercise.  He has not had any caffeine since 1977.  He takes a multivitamin but no other over-the-counter medications or supplements.  He denies any chest pain, shortness of breath, lower extreme edema, orthopnea, or PND.  At his initial assessment carvedilol was switched to nebivolol due to intolerance.  Spironolactone was also added to his regimen.  He was referred to the prep [employer] program.  However he has been exercising for 60 to 90 minutes daily on his own.  He last our pharmacist on 07/2020 and was doing well.  His blood pressure was averaging 130/64 in  the remote patient monitoring up.  He was also referred for renal artery Dopplers that revealed bilateral renal cysts and an abnormal resistive index bilaterally.  He had 1 to 59% stenosis bilaterally and there was a question of fibromuscular dysplasia in both arteries.  Since his appointment he had a stroke 03/2020 and was found to have critical L carotid stenosis.   He underwent TCAR and was started on Plavix and atorvastatin.  He saw Dr. Fletcher Anon who recommended that given that his blood pressures were now well have been controlled they would continue to monitor him annually.  At his last appointment his blood pressure was well-controlled.  He was to follow-up in 6 months.  In the interim he saw his PCP and had labs that revealed worsening renal function.  He was instructed to spironolactone to 12.5 mg and take half of his irbesartan/HCTZ tablet.  He made this change in his blood pressure has been averaging 110s to the low 130s over 60s.  He continues to exercise daily for 1 hour.  He has no exertional chest pain or shortness of breath.  He denies lower extremity edema, orthopnea, or PND.  Previous antihypertensives: Lisinopril    Past Medical History:  Diagnosis Date   Hypertension     Past Surgical History:  Procedure Laterality Date   TRANSCAROTID ARTERY REVASCULARIZATION Left 04/18/2020   Procedure: TRANSCAROTID ARTERY REVASCULARIZATION LEFT;  Surgeon: Marty Heck, MD;  Location: [place];  Service: Vascular;  Laterality: Left;    Current Medications: Current Meds  Medication Sig   amLODipine (NORVASC) 10 MG tablet Take 1 tablet (10 mg total) by mouth daily.   aspirin 81 MG chewable tablet Chew 1 tablet (81 mg total) by mouth daily.   atorvastatin (LIPITOR) 40 MG tablet Take 1 tablet (40 mg total) by mouth daily.   clopidogrel (PLAVIX) 75 MG tablet Take 1 tablet (75 mg total) by mouth daily.   doxazosin (CARDURA) 4 MG tablet Take 1 tablet (4 mg total) by mouth daily.    irbesartan-hydrochlorothiazide (AVALIDE) 150-12.5 MG tablet Take 1 tablet by mouth as directed. 1/2 TABLET DAILY   Multiple Vitamin (MULTIVITAMIN WITH MINERALS) TABS tablet Take 1 tablet by mouth daily.   nebivolol (BYSTOLIC) 10 MG tablet Take 1 tablet (10 mg total) by mouth daily.   sildenafil (VIAGRA) 50 MG tablet Take 50 mg by mouth daily as needed for erectile dysfunction. Take one tablet as needed for erectile dysfunction   spironolactone (ALDACTONE) 25 MG tablet Take 25 mg by mouth as directed. 1/2 TABLET DAILY   [DISCONTINUED] irbesartan-hydrochlorothiazide (AVALIDE) 150-12.5 MG tablet Take 1 tablet by mouth daily.   [DISCONTINUED] spironolactone (ALDACTONE) 25 MG tablet Take 1 tablet (25 mg total) by mouth daily. (Patient taking differently: Take 25 mg by mouth as directed. 1/2 TABLET DAILY)     Allergies:   Patient has no known allergies.   Social History   Socioeconomic History   Marital status: Married    Spouse name: Not on file   Number of children: Not on file   Years of education: Not on file   Highest education level: Not on file  Occupational History   Not on file  Tobacco Use   Smoking status: Never Smoker   Smokeless tobacco: Never Used  Vaping Use   Vaping Use: Never used  Substance and Sexual Activity   Alcohol use: Not on file   Drug use: Not on file   Sexual activity: Not on file  Other Topics Concern   Not on file  Social History Narrative   Not on file   Social Determinants of Health   Financial Resource Strain:    Difficulty of Paying Living Expenses: Not on file  Food Insecurity: No Food Insecurity   Worried About [place] in the Last Year: Never true   [place] in the Last Year: Never true  Transportation Needs: No Transportation Needs   Lack of Transportation (Medical): No   Lack of Transportation (Non-Medical): No  Physical Activity: Insufficiently Active   Days of Exercise per Week: 3 days    Minutes of Exercise per Session: 30 min  Stress: No Stress Concern Present   Feeling of Stress : Not at all  Social Connections:    Frequency of Communication with Friends and Family: Not on file   Frequency of Social Gatherings with Friends and Family: Not on file   Attends Religious Services: Not on file   Active Member of Clubs or Organizations: Not on file   Attends Archivist Meetings: Not on file   Marital Status: Not on file     Family History: The patient's family history includes Hypertension in his mother.  ROS:   Please see the history of present illness.    All other systems reviewed and are negative.  EKGs/Labs/Other Studies Reviewed:    EKG:  EKG is not ordered today.  The ekg ordered  demonstrates sinus rhythm.  Frequent PVCs.  Recent Labs: 04/14/2020: ALT 25 04/19/2020: Hemoglobin 11.6; Platelets 233 06/27/2020: BUN 19; Creatinine, Ser 1.28; Potassium 4.5; Sodium 140   Recent Lipid Panel    Component Value Date/Time   CHOL 186 04/15/2020 0311   TRIG 47 04/15/2020 0311   HDL 68 04/15/2020 0311   CHOLHDL 2.7 04/15/2020 0311   VLDL 9 04/15/2020 0311   LDLCALC 109 (H) 04/15/2020 0311    Physical Exam:    VS:  BP (!) 141/73    Pulse 86    Temp (!) 96.3 F (35.7 C)    Ht 6' (1.829 m)    Wt 225 lb 3.2 oz (102.2 kg)    SpO2 98%    BMI 30.54 kg/m  , BMI Body mass index is 30.54 kg/m. GENERAL:  Well appearing HEENT: Pupils equal round and reactive, fundi not visualized, oral mucosa unremarkable NECK:  No jugular venous distention, waveform within normal limits, carotid upstroke brisk and symmetric, no bruits LUNGS:  Clear to auscultation bilaterally HEART:  RRR with occasional ectopy.  PMI not displaced or sustained,S1 and S2 within normal limits, no S3, no S4, no clicks, no rubs, no murmurs ABD:  Flat, positive bowel sounds normal in frequency in pitch, no bruits, no rebound, no guarding, no midline pulsatile mass, no hepatomegaly, no  splenomegaly EXT:  2 plus pulses throughout, no edema, no cyanosis no clubbing SKIN:  No rashes no nodules NEURO:  Cranial nerves II through XII grossly intact, motor grossly intact throughout PSYCH:  Cognitively intact, oriented to person place and time   ASSESSMENT:    1. Essential hypertension   2. Acute ischemic stroke ([place])   3. PVC's (premature ventricular contractions)   4. Stage 3a chronic kidney disease (HCC)     PLAN:    # Essential hypertension:  Mr. Ryser blood pressure was above goal in the office today.  However he brings extensive records showing that it has been very well-controlled at home.  Given that his renal function has improved, it is quite reasonable to keep him on the lower dose of irbesartan, HCTZ, and spironolactone.  Continue amlodipine and doxazosin as well as nebivolol.  If his blood pressure remains elevated in the past would consider increasing the doxazosin to 8 mg.  # PVCs: Stable.  Denies palpitations.   # Renal artery stenosis: Mild renal artery stenosis bilaterally with a question of FMD.  Abnormal resistive index bilaterally.  Repeat renal artery Dopplers 02/2020.  Per Dr. Fletcher Anon, consider CT a or MRA in the future.   Disposition:    FU with Tab Rylee C. Oval Linsey, MD, [facility] as needed.  Continued follow-up with [name].   Medication Adjustments/Labs and Tests Ordered: Current medicines are reviewed at length with the patient today.  Concerns regarding medicines are outlined above.  No orders of the defined types were placed in this encounter.  No orders of the defined types were placed in this encounter.    Signed, Skeet Latch, MD  10/31/2020 2:28 PM    [place]

## 2020-12-03 ENCOUNTER — Other Ambulatory Visit: Payer: Self-pay | Admitting: Internal Medicine

## 2020-12-09 DIAGNOSIS — M25511 Pain in right shoulder: Secondary | ICD-10-CM | POA: Diagnosis not present

## 2021-03-07 ENCOUNTER — Telehealth: Payer: Self-pay | Admitting: Cardiovascular Disease

## 2021-03-07 DIAGNOSIS — I10 Essential (primary) hypertension: Secondary | ICD-10-CM

## 2021-03-07 DIAGNOSIS — N1831 Chronic kidney disease, stage 3a: Secondary | ICD-10-CM

## 2021-03-07 NOTE — Telephone Encounter (Signed)
Called patient and noted that I did not see documentation about the phone to address what it was about. Asked if any information was left on voice mail. Patient states it was just and generic call me back. Advised Rip Harbour is off today and I would forward this message to her to call him back when she returns to the office.  Patient agreeable.

## 2021-03-07 NOTE — Telephone Encounter (Signed)
Follow Up:     Pt is returning Melinda's call from yesterday.

## 2021-03-11 NOTE — Telephone Encounter (Signed)
Patient is following up, requesting to speak with Dr. Blenda Mounts nurse.

## 2021-03-11 NOTE — Telephone Encounter (Signed)
-----   Message from Skeet Latch, MD sent at 03/03/2021 12:15 PM EDT ----- Did he have another BMP since 10/03/20 when his renal function was worse?  Dr. Orland Mustard made appropriate adjustments to his BP meds, but I want to make sure his creatinine is improving.  If not, please come for BMP.

## 2021-03-11 NOTE — Telephone Encounter (Signed)
Discussed labs with patient, he has not had a recheck and will come to office to have done

## 2021-03-11 NOTE — Telephone Encounter (Signed)
Spoke to patient stated he was returning Melinda's call.Advised I will send message to her.

## 2021-04-10 ENCOUNTER — Encounter: Payer: Self-pay | Admitting: *Deleted

## 2021-04-17 DIAGNOSIS — I6529 Occlusion and stenosis of unspecified carotid artery: Secondary | ICD-10-CM | POA: Diagnosis not present

## 2021-04-17 DIAGNOSIS — I700 Atherosclerosis of aorta: Secondary | ICD-10-CM | POA: Diagnosis not present

## 2021-04-17 DIAGNOSIS — Z8673 Personal history of transient ischemic attack (TIA), and cerebral infarction without residual deficits: Secondary | ICD-10-CM | POA: Diagnosis not present

## 2021-04-17 DIAGNOSIS — Z1211 Encounter for screening for malignant neoplasm of colon: Secondary | ICD-10-CM | POA: Diagnosis not present

## 2021-04-17 DIAGNOSIS — E785 Hyperlipidemia, unspecified: Secondary | ICD-10-CM | POA: Diagnosis not present

## 2021-04-17 DIAGNOSIS — Z23 Encounter for immunization: Secondary | ICD-10-CM | POA: Diagnosis not present

## 2021-04-17 DIAGNOSIS — N183 Chronic kidney disease, stage 3 unspecified: Secondary | ICD-10-CM | POA: Diagnosis not present

## 2021-04-17 DIAGNOSIS — E559 Vitamin D deficiency, unspecified: Secondary | ICD-10-CM | POA: Diagnosis not present

## 2021-04-17 DIAGNOSIS — Z0000 Encounter for general adult medical examination without abnormal findings: Secondary | ICD-10-CM | POA: Diagnosis not present

## 2021-04-17 DIAGNOSIS — I10 Essential (primary) hypertension: Secondary | ICD-10-CM | POA: Diagnosis not present

## 2021-04-17 DIAGNOSIS — D649 Anemia, unspecified: Secondary | ICD-10-CM | POA: Diagnosis not present

## 2021-04-22 ENCOUNTER — Ambulatory Visit (HOSPITAL_COMMUNITY)
Admission: RE | Admit: 2021-04-22 | Discharge: 2021-04-22 | Disposition: A | Discharge status: Home / Self Care | Payer: Medicare Other | Source: Ambulatory Visit | Attending: Vascular Surgery | Admitting: Vascular Surgery

## 2021-04-22 ENCOUNTER — Ambulatory Visit: Payer: Medicare Other | Admitting: Vascular Surgery

## 2021-04-22 ENCOUNTER — Other Ambulatory Visit: Payer: Self-pay

## 2021-04-22 ENCOUNTER — Encounter: Payer: Self-pay | Admitting: Vascular Surgery

## 2021-04-22 VITALS — BP 124/66 | HR 61 | Temp 97.9°F | Resp 20 | Ht 72.0 in | Wt 227.0 lb

## 2021-04-22 DIAGNOSIS — I6522 Occlusion and stenosis of left carotid artery: Secondary | ICD-10-CM | POA: Diagnosis not present

## 2021-04-22 NOTE — Progress Notes (Signed)
Patient name: Samuel George MRN: [id number] DOB: [birth year] Sex: male  REASON FOR VISIT: 82-month follow-up for left TCAR surveillance  HPI: Samuel George is a 70 y.o. male that presents for 9 month follow-up after left TCAR on 04/18/2020 for symptomatic high-grade stenosis with string sign and high cervical lesion.  On follow-up today reports he is doing well.  He's had no new neurologic events of the last 9 months.  He still taking aspirin and Plavix.  No specific concerns today.  Past Medical History:  Diagnosis Date  . Hypertension     Past Surgical History:  Procedure Laterality Date  . TRANSCAROTID ARTERY REVASCULARIZATION Left 04/18/2020   Procedure: TRANSCAROTID ARTERY REVASCULARIZATION LEFT;  Surgeon: Marty Heck, MD;  Location: [facility] OR;  Service: Vascular;  Laterality: Left;    Family History  Problem Relation Age of Onset  . Hypertension Mother     SOCIAL HISTORY: Social History   Tobacco Use  . Smoking status: Never Smoker  . Smokeless tobacco: Never Used  Substance Use Topics  . Alcohol use: Yes    No Known Allergies  Current Outpatient Medications  Medication Sig Dispense Refill  . amLODipine (NORVASC) 10 MG tablet Take 1 tablet (10 mg total) by mouth daily. 180 tablet 1  . aspirin 81 MG chewable tablet Chew 1 tablet (81 mg total) by mouth daily. 180 tablet 1  . atorvastatin (LIPITOR) 40 MG tablet Take 1 tablet (40 mg total) by mouth daily. 180 tablet 1  . clopidogrel (PLAVIX) 75 MG tablet Take 1 tablet (75 mg total) by mouth daily. 180 tablet 1  . doxazosin (CARDURA) 4 MG tablet Take 1 tablet (4 mg total) by mouth daily. 180 tablet 1  . fexofenadine (ALLEGRA) 180 MG tablet 1 tablet    . irbesartan-hydrochlorothiazide (AVALIDE) 150-12.5 MG tablet Take 1 tablet by mouth as directed. 1/2 TABLET DAILY    . Multiple Vitamin (MULTIVITAMIN WITH MINERALS) TABS tablet Take 1 tablet by mouth daily.    . nebivolol (BYSTOLIC) 10 MG tablet Take 1 tablet (10 mg  total) by mouth daily. 180 tablet 1  . sildenafil (VIAGRA) 100 MG tablet SMARTSIG:1 Tablet(s) By Mouth    . spironolactone (ALDACTONE) 25 MG tablet Take 25 mg by mouth as directed. 1/2 TABLET DAILY     No current facility-administered medications for this visit.    REVIEW OF SYSTEMS:  [X]  denotes positive finding, [ ]  denotes negative finding Cardiac  Comments:  Chest pain or chest pressure:    Shortness of breath upon exertion:    Short of breath when lying flat:    Irregular heart rhythm:        Vascular    Pain in calf, thigh, or hip brought on by ambulation:    Pain in feet at night that wakes you up from your sleep:     Blood clot in your veins:    Leg swelling:         Pulmonary    Oxygen at home:    Productive cough:     Wheezing:         Neurologic    Sudden weakness in arms or legs:     Sudden numbness in arms or legs:     Sudden onset of difficulty speaking or slurred speech:    Temporary loss of vision in one eye:     Problems with dizziness:         Gastrointestinal    Blood in stool:  Vomited blood:         Genitourinary    Burning when urinating:     Blood in urine:        Psychiatric    Major depression:         Hematologic    Bleeding problems:    Problems with blood clotting too easily:        Skin    Rashes or ulcers:        Constitutional    Fever or chills:      PHYSICAL EXAM: Vitals:   04/22/21 1126 04/22/21 1128  BP: 115/69 124/66  Pulse: 61   Resp: 20   Temp: 97.9 F (36.6 C)   SpO2: 98%   Weight: 227 lb (103 kg)   Height: 6' (1.829 m)     GENERAL: The patient is a well-nourished male, in no acute distress. The vital signs are documented above. CARDIAC: There is a regular rate and rhythm.  VASCULAR:  Left neck incision well-healed. PULMONARY: No respiratory distress. ABDOMEN: Soft and non-tender with normal pitched bowel sounds.  MUSCULOSKELETAL: There are no major deformities or cyanosis. NEUROLOGIC: No focal  weakness or paresthesias are detected.  CN II-XII grossly intact.    DATA:   I independently reviewed his carotid duplex from today and the left ICA stent looks widely patent with some non-flow limiting disease in ICA distal to stent.  His contralateral right ICA shows no significant stenosis.  Assessment/Plan:  70 year old male status post left TCAR on 04/18/2020 for symptomatic high-grade stenosis with string sign and high cervical lesion that presents for 41-month follow-up today.  He is doing well.  I think his left carotid stent looks widely patent on duplex.  He does have a little bit of disease in the ICA distally but this does not appear significant.  I asked that he stay on aspirin Plavix from my standpoint.  I will see him again in 1 year for continued surveillance.  Discussed he call with questions or concerns.  Marty Heck, MD [facility] Office: [place]

## 2021-04-24 DIAGNOSIS — R490 Dysphonia: Secondary | ICD-10-CM | POA: Diagnosis not present

## 2021-04-24 DIAGNOSIS — J300 Vasomotor rhinitis: Secondary | ICD-10-CM | POA: Diagnosis not present

## 2021-05-15 DIAGNOSIS — D500 Iron deficiency anemia secondary to blood loss (chronic): Secondary | ICD-10-CM | POA: Diagnosis not present

## 2021-05-15 DIAGNOSIS — N183 Chronic kidney disease, stage 3 unspecified: Secondary | ICD-10-CM | POA: Diagnosis not present

## 2021-05-15 DIAGNOSIS — Z8673 Personal history of transient ischemic attack (TIA), and cerebral infarction without residual deficits: Secondary | ICD-10-CM | POA: Diagnosis not present

## 2021-07-15 ENCOUNTER — Encounter: Payer: Self-pay | Admitting: Cardiovascular Disease

## 2021-07-15 ENCOUNTER — Ambulatory Visit: Payer: Medicare Other | Admitting: Cardiovascular Disease

## 2021-07-15 ENCOUNTER — Other Ambulatory Visit: Payer: Self-pay

## 2021-07-15 VITALS — BP 130/74 | HR 67 | Ht 71.0 in | Wt 232.2 lb

## 2021-07-15 DIAGNOSIS — I779 Disorder of arteries and arterioles, unspecified: Secondary | ICD-10-CM

## 2021-07-15 DIAGNOSIS — I701 Atherosclerosis of renal artery: Secondary | ICD-10-CM | POA: Diagnosis not present

## 2021-07-15 DIAGNOSIS — E785 Hyperlipidemia, unspecified: Secondary | ICD-10-CM | POA: Diagnosis not present

## 2021-07-15 DIAGNOSIS — I493 Ventricular premature depolarization: Secondary | ICD-10-CM | POA: Diagnosis not present

## 2021-07-15 NOTE — Patient Instructions (Signed)
Medication Instructions:  No Changes In Medications at this time.  *If you need a refill on your cardiac medications before your next appointment, please call your pharmacy*  Testing/Procedures: Your physician has requested that you have a renal artery duplex. During this test, an ultrasound is used to evaluate blood flow to the kidneys. Allow one hour for this exam. Do not eat after midnight the day before and avoid carbonated beverages. Take your medications as you usually do.  Follow-Up: At [facility], you and your health needs are our priority.  As part of our continuing mission to provide you with exceptional heart care, we have created designated Provider Care Teams.  These Care Teams include your primary Cardiologist (physician) and Advanced Practice Providers (APPs -  Physician Assistants and Nurse Practitioners) who all work together to provide you with the care you need, when you need it.   Your next appointment:   1 year(s)  The format for your next appointment:   In Person  Provider:   Kathlyn Sacramento, MD

## 2021-07-15 NOTE — Progress Notes (Signed)
Cardiology Office Note   Date:  07/15/2021   ID:  Samuel George, DOB 12-05-1950, MRN [id number]  PCP:  London Pepper, MD  Cardiologist: Dr. Oval Linsey   No chief complaint on file.     History of Present Illness: Samuel George is a 70 y.o. male who is here today for a follow-up visit regarding mild renal artery stenosis.   He has known history of hypertension and PVCs.  He was diagnosed with hypertension in December of 2020 and his blood pressure required multiple antihypertensive medication.  He is status post TCAR for critical left carotid stenosis.    He had renal artery duplex in April of 2021 which showed mild nonobstructive bilateral renal artery stenosis less than 60% with some changes suggestive of mild fibromuscular dysplasia.   He continues to work as\ an Scientist, physiological at [employer].  His blood pressure has been well controlled on current medications.  Most recent creatinine was 1.7.  He is noted to have frequent PVCs today but it appears that he has history of PVCs and he is completely asymptomatic.  Echocardiogram in 2021 showed normal LV systolic function. He walks 4 miles daily for exercise on a treadmill at a speed of 4.2 mph.  He has no exertional symptoms.  Past Medical History:  Diagnosis Date   Hypertension     Past Surgical History:  Procedure Laterality Date   TRANSCAROTID ARTERY REVASCULARIZATION  Left 04/18/2020   Procedure: TRANSCAROTID ARTERY REVASCULARIZATION LEFT;  Surgeon: Marty Heck, MD;  Location: [facility] OR;  Service: Vascular;  Laterality: Left;     Current Outpatient Medications  Medication Sig Dispense Refill   amLODipine (NORVASC) 10 MG tablet Take 1 tablet (10 mg total) by mouth daily. 180 tablet 1   aspirin 81 MG chewable tablet Chew 1 tablet (81 mg total) by mouth daily. 180 tablet 1   atorvastatin (LIPITOR) 40 MG tablet Take 1 tablet (40 mg total) by mouth daily. 180 tablet 1   clopidogrel (PLAVIX) 75 MG tablet Take 1  tablet (75 mg total) by mouth daily. 180 tablet 1   doxazosin (CARDURA) 4 MG tablet Take 1 tablet (4 mg total) by mouth daily. 180 tablet 1   fexofenadine (ALLEGRA) 180 MG tablet 1 tablet     irbesartan-hydrochlorothiazide (AVALIDE) 150-12.5 MG tablet Take 1 tablet by mouth as directed. 1/2 TABLET DAILY     Multiple Vitamin (MULTIVITAMIN WITH MINERALS) TABS tablet Take 1 tablet by mouth daily.     nebivolol (BYSTOLIC) 10 MG tablet Take 1 tablet (10 mg total) by mouth daily. 180 tablet 1   sildenafil (VIAGRA) 100 MG tablet SMARTSIG:1 Tablet(s) By Mouth     spironolactone (ALDACTONE) 25 MG tablet Take 25 mg by mouth as directed. 1/2 TABLET DAILY     No current facility-administered medications for this visit.    Allergies:   Patient has no known allergies.    Social History:  The patient  reports that he has never smoked. He has never used smokeless tobacco. He reports current alcohol use. He reports that he does not use drugs.   Family History:  The patient's family history includes Hypertension in his mother.    ROS:  Please see the history of present illness.   Otherwise, review of systems are positive for none.   All other systems are reviewed and negative.    PHYSICAL EXAM: VS:  BP 130/74   Pulse 67   Ht '5\' 11"'$  (1.803 m)   Wt 232 lb  3.2 oz (105.3 kg)   SpO2 99%   BMI 32.39 kg/m  , BMI Body mass index is 32.39 kg/m. GEN: Well nourished, well developed, in no acute distress  HEENT: normal  Neck: no JVD, carotid bruits, or masses Cardiac: RRR; no murmurs, rubs, or gallops,no edema  Respiratory:  clear to auscultation bilaterally, normal work of breathing GI: soft, nontender, nondistended, + BS MS: no deformity or atrophy  Skin: warm and dry, no rash Neuro:  Strength and sensation are intact Psych: euthymic mood, full affect   EKG:  EKG is ordered today. EKG showed sinus rhythm with first-degree AV block and frequent PVCs.   Recent Labs: No results found for requested  labs within last 8760 hours.    Lipid Panel    Component Value Date/Time   CHOL 186 04/15/2020 0311   TRIG 47 04/15/2020 0311   HDL 68 04/15/2020 0311   CHOLHDL 2.7 04/15/2020 0311   VLDL 9 04/15/2020 0311   LDLCALC 109 (H) 04/15/2020 0311      Wt Readings from Last 3 Encounters:  07/15/21 232 lb 3.2 oz (105.3 kg)  04/22/21 227 lb (103 kg)  10/31/20 225 lb 3.2 oz (102.2 kg)       No flowsheet data found.    ASSESSMENT AND PLAN:  1.  Essential hypertension with possible underlying renal artery stenosis: His renal artery stenosis was not severe enough to require revascularization.  I requested repeat renal artery duplex to ensure stability especially with some worsening of chronic kidney disease.    2.  History of stroke: Status post likely due to critical left TCAR.   3.  Hyperlipidemia: On atorvastatin.  Recommended target LDL of less than 70.  4.  Frequent PVCs: His EKG showed frequent PVCs but he is completely asymptomatic.  He had work-up for this last year which included an echocardiogram that showed normal LV systolic function.  In addition, outpatient monitor showed 14.8% burden of PVCs.  Given normal ejection fraction and no symptoms, no change in therapy is needed right now.  He is currently on Bystolic.    Disposition:   FU with me in 12 months  Signed,  Kathlyn Sacramento, MD  07/15/2021 8:46 AM    [place]

## 2021-07-30 ENCOUNTER — Ambulatory Visit (HOSPITAL_COMMUNITY)
Admission: RE | Admit: 2021-07-30 | Discharge: 2021-07-30 | Disposition: A | Discharge status: Home / Self Care | Payer: Medicare Other | Source: Ambulatory Visit | Attending: Internal Medicine | Admitting: Internal Medicine

## 2021-07-30 ENCOUNTER — Other Ambulatory Visit: Payer: Self-pay

## 2021-07-30 DIAGNOSIS — I701 Atherosclerosis of renal artery: Secondary | ICD-10-CM | POA: Diagnosis not present

## 2021-07-31 DIAGNOSIS — Z8673 Personal history of transient ischemic attack (TIA), and cerebral infarction without residual deficits: Secondary | ICD-10-CM | POA: Diagnosis not present

## 2021-07-31 DIAGNOSIS — I10 Essential (primary) hypertension: Secondary | ICD-10-CM | POA: Diagnosis not present

## 2021-07-31 DIAGNOSIS — E785 Hyperlipidemia, unspecified: Secondary | ICD-10-CM | POA: Diagnosis not present

## 2021-07-31 DIAGNOSIS — N183 Chronic kidney disease, stage 3 unspecified: Secondary | ICD-10-CM | POA: Diagnosis not present

## 2021-08-28 DIAGNOSIS — D124 Benign neoplasm of descending colon: Secondary | ICD-10-CM | POA: Diagnosis not present

## 2021-08-28 DIAGNOSIS — K2930 Chronic superficial gastritis without bleeding: Secondary | ICD-10-CM | POA: Diagnosis not present

## 2021-08-28 DIAGNOSIS — K449 Diaphragmatic hernia without obstruction or gangrene: Secondary | ICD-10-CM | POA: Diagnosis not present

## 2021-08-28 DIAGNOSIS — K648 Other hemorrhoids: Secondary | ICD-10-CM | POA: Diagnosis not present

## 2021-08-28 DIAGNOSIS — D122 Benign neoplasm of ascending colon: Secondary | ICD-10-CM | POA: Diagnosis not present

## 2021-08-28 DIAGNOSIS — D125 Benign neoplasm of sigmoid colon: Secondary | ICD-10-CM | POA: Diagnosis not present

## 2021-08-28 DIAGNOSIS — D509 Iron deficiency anemia, unspecified: Secondary | ICD-10-CM | POA: Diagnosis not present

## 2021-08-28 DIAGNOSIS — K5730 Diverticulosis of large intestine without perforation or abscess without bleeding: Secondary | ICD-10-CM | POA: Diagnosis not present

## 2021-08-28 DIAGNOSIS — D175 Benign lipomatous neoplasm of intra-abdominal organs: Secondary | ICD-10-CM | POA: Diagnosis not present

## 2021-08-28 DIAGNOSIS — K2980 Duodenitis without bleeding: Secondary | ICD-10-CM | POA: Diagnosis not present

## 2021-09-03 DIAGNOSIS — D125 Benign neoplasm of sigmoid colon: Secondary | ICD-10-CM | POA: Diagnosis not present

## 2021-09-03 DIAGNOSIS — K2930 Chronic superficial gastritis without bleeding: Secondary | ICD-10-CM | POA: Diagnosis not present

## 2021-09-03 DIAGNOSIS — D124 Benign neoplasm of descending colon: Secondary | ICD-10-CM | POA: Diagnosis not present

## 2021-09-21 ENCOUNTER — Other Ambulatory Visit: Payer: Self-pay | Admitting: Cardiovascular Disease

## 2021-11-05 DIAGNOSIS — R404 Transient alteration of awareness: Secondary | ICD-10-CM | POA: Diagnosis not present

## 2021-11-30 DIAGNOSIS — 419620001 Death: Secondary | SNOMED CT | POA: Diagnosis not present

## 2021-11-30 DEATH — deceased

## 2022-06-22 ENCOUNTER — Telehealth: Payer: Self-pay

## 2022-06-22 DIAGNOSIS — Z0000 Encounter for general adult medical examination without abnormal findings: Secondary | ICD-10-CM

## 2022-06-22 NOTE — Telephone Encounter (Signed)
Called patient to determine if he returned Vivify cuff upon his completion of the program or if he still had access to the device and need to return it. Left message for patient to return call to provide update on the status of the cuff.    Angeni Chaudhuri [place], [facility] Guide, Health Coach [address], [address] [place] [address] Telephone: [phone] Email: [email or web address]
